# Patient Record
Sex: Male | Born: 1935 | Race: Black or African American | Hispanic: No | Smoking: Former smoker
Health system: Southern US, Community
[De-identification: ages and names within clinical notes are randomized; demographics above are authoritative.]

## PROBLEM LIST (undated history)

## (undated) DIAGNOSIS — J690 Pneumonitis due to inhalation of food and vomit: Secondary | ICD-10-CM

## (undated) DIAGNOSIS — F419 Anxiety disorder, unspecified: Secondary | ICD-10-CM

## (undated) DIAGNOSIS — S065XAA Traumatic subdural hemorrhage with loss of consciousness status unknown, initial encounter: Secondary | ICD-10-CM

## (undated) DIAGNOSIS — D649 Anemia, unspecified: Secondary | ICD-10-CM

## (undated) DIAGNOSIS — M24549 Contracture, unspecified hand: Secondary | ICD-10-CM

## (undated) DIAGNOSIS — I2699 Other pulmonary embolism without acute cor pulmonale: Secondary | ICD-10-CM

## (undated) DIAGNOSIS — F39 Unspecified mood [affective] disorder: Secondary | ICD-10-CM

## (undated) DIAGNOSIS — F0390 Unspecified dementia without behavioral disturbance: Secondary | ICD-10-CM

## (undated) DIAGNOSIS — N289 Disorder of kidney and ureter, unspecified: Secondary | ICD-10-CM

## (undated) DIAGNOSIS — I639 Cerebral infarction, unspecified: Secondary | ICD-10-CM

## (undated) DIAGNOSIS — I82409 Acute embolism and thrombosis of unspecified deep veins of unspecified lower extremity: Secondary | ICD-10-CM

## (undated) DIAGNOSIS — I10 Essential (primary) hypertension: Secondary | ICD-10-CM

## (undated) DIAGNOSIS — I6200 Nontraumatic subdural hemorrhage, unspecified: Secondary | ICD-10-CM

## (undated) DIAGNOSIS — E871 Hypo-osmolality and hyponatremia: Secondary | ICD-10-CM

## (undated) DIAGNOSIS — R1310 Dysphagia, unspecified: Secondary | ICD-10-CM

## (undated) DIAGNOSIS — I959 Hypotension, unspecified: Secondary | ICD-10-CM

## (undated) DIAGNOSIS — G629 Polyneuropathy, unspecified: Secondary | ICD-10-CM

## (undated) DIAGNOSIS — S065X9A Traumatic subdural hemorrhage with loss of consciousness of unspecified duration, initial encounter: Secondary | ICD-10-CM

## (undated) HISTORY — DX: Pneumonitis due to inhalation of food and vomit: J69.0

## (undated) HISTORY — DX: Acute embolism and thrombosis of unspecified deep veins of unspecified lower extremity: I82.409

## (undated) HISTORY — PX: OTHER SURGICAL HISTORY: SHX169

## (undated) HISTORY — DX: Unspecified dementia, unspecified severity, without behavioral disturbance, psychotic disturbance, mood disturbance, and anxiety: F03.90

---

## 2010-10-01 ENCOUNTER — Encounter: Admission: RE | Admit: 2010-10-01 | Discharge: 2010-10-01 | Payer: Self-pay | Admitting: Internal Medicine

## 2013-02-26 HISTORY — PX: PEG PLACEMENT: SHX5437

## 2013-09-16 ENCOUNTER — Other Ambulatory Visit (HOSPITAL_COMMUNITY): Payer: Self-pay | Admitting: Internal Medicine

## 2013-09-16 DIAGNOSIS — R1310 Dysphagia, unspecified: Secondary | ICD-10-CM

## 2013-09-23 ENCOUNTER — Other Ambulatory Visit (HOSPITAL_COMMUNITY): Payer: Self-pay | Admitting: Internal Medicine

## 2013-09-23 ENCOUNTER — Ambulatory Visit (HOSPITAL_COMMUNITY)
Admission: RE | Admit: 2013-09-23 | Discharge: 2013-09-23 | Disposition: A | Discharge status: Home / Self Care | Payer: PRIVATE HEALTH INSURANCE | Source: Ambulatory Visit | Attending: Internal Medicine | Admitting: Internal Medicine

## 2013-09-23 DIAGNOSIS — R1310 Dysphagia, unspecified: Secondary | ICD-10-CM | POA: Insufficient documentation

## 2013-09-23 DIAGNOSIS — R1311 Dysphagia, oral phase: Secondary | ICD-10-CM | POA: Insufficient documentation

## 2013-09-23 DIAGNOSIS — IMO0001 Reserved for inherently not codable concepts without codable children: Secondary | ICD-10-CM | POA: Insufficient documentation

## 2013-09-23 NOTE — Procedures (Signed)
Objective Swallowing Evaluation: Modified Barium Swallowing Study   Patient Details  Name: Juan Owens MRN: [id number] Date of Birth: 10-31-1931  Today's Date: 09/23/2013 Time: 11:03 AM - 11:35 AM    Past Medical History: No past medical history on file. Past Surgical History: No past surgical history on file. HPI:  Mr. Juan Owens is an 79 yo male resident at [facility] who is referred for MBSS. History was provided by his daughter who accompanied him to the appointment. Pt has dementia and fell several months ago and sustained SDH where he resided and subsequently had PEG placed in March 2014. He is currently at [facility] for rehabilitation. Dtr. reports that pt has had po trials since early September and has been stealing food off of trays because he wants to eat. She is hopeful that he can begin a consistent po diet and is willing to sign a waiver if need be.   Symptoms/Limitations Symptoms: Pt with dementia, accompanied by his daughter. Special Tests: MBSS  Recommendation/Prognosis  Clinical Impression Dysphagia Diagnosis: Mild oral phase dysphagia;Moderate pharyngeal phase dysphagia  Clinical impression: Mild oral phase and moderate pharyngeal phase sensorimotor based dysphagia in setting of dementia characterized by prolonged oral transit with solids, delay in swallow initiation with swallow trigger after filling pyriforms for liquids and puree, mild decreased tongue base retraction and epiglottic deflection resulting in variable flash penetration during the swallow with liquids and puree, mod pharyngeal residue. Pt does not appear sensate to pharyngeal residue consistently and does not respond with cues to repeat swallow (despite dry spoon presentation). Pt aspirates trace amounts of residuals when secondary swallow is elicited. Oral residuals trickle down to valleculae and are at risk for aspiration. Pt had subtle throat clears intermittently, but only strong spontaneous  coughs were effective in removing aspirate. Pt is at risk for aspiration on all consistencies/textures due to dementia, decreased sensation, and inability to follow cues to repeat swallow. I suspect aspiration would consistently be in trace amounts, however over the course of a meal/day it would be greater. Pt attempted to consume everything I gave him in its entirety and wanted more. He shows a tremendous interest in eating food by mouth. I suggested that daughter speak with his doctor in regards to offering pt a "safest diet" (accepting known risk for aspiration for comfort feeds) for pt's pleasure.  His daughter is willing to sign a waiver if available for her father to be able to eat by mouth. Should this be the course chosen, I recommend puree diet with honeythick liquids via teaspoon presentation. Information relayed verbally over the phone to treating SLP at [facility].  Swallow Evaluation Recommendations Diet Recommendations: Alternative means - long-term;Dysphagia 1 (Puree);Honey-thick liquid (safest diet rec) Liquid Administration via: Spoon Medication Administration: Via alternative means Supervision: Patient able to self feed;Full supervision/cueing for compensatory strategies;Trained caregiver to feed patient Compensations: Small sips/bites;Multiple dry swallows after each bite/sip;Hard cough after swallow Postural Changes and/or Swallow Maneuvers: Out of bed for meals;Seated upright 90 degrees;Upright 30-60 min after meal Oral Care Recommendations: Oral care before and after PO;Staff/trained caregiver to provide oral care Other Recommendations: Order thickener from pharmacy;Clarify dietary restrictions Follow up Recommendations: Skilled Nursing facility Prognosis Prognosis for Safe Diet Advancement: Fair Barriers to Reach Goals: Cognitive deficits Individuals Consulted Consulted and Agree with Results and Recommendations: Family member/caregiver Family Member Consulted: daughter Report  Sent to : Primary SLP;Facility (Comment)  General:  Date of Onset: 09/12/13 HPI: Mr. Juan Owens is an 79 yo male resident at [facility] who is referred for MBSS. History was provided by his daughter who accompanied him to the appointment. Pt has dementia and fell several months ago and sustained SDH where he resided and subsequently had PEG placed in March 2014. He is currently at [facility] for rehabilitation. Dtr. reports that pt has had po trials since early September and has been stealing food off of trays because he wants to eat. She is hopeful that he can begin a consistent po diet and is willing to sign a waiver if need be.  Type of Study: Modified Barium Swallowing Study Reason for Referral: Objectively evaluate swallowing function Previous Swallow Assessment: Reportedly had one at the end of August at [facility] Diet Prior to this Study: PEG tube Temperature Spikes Noted: No Respiratory Status: Room air History of Recent Intubation: No Behavior/Cognition: Alert;Cooperative;Requires cueing;Doesn't follow directions Oral Cavity - Dentition: Adequate natural dentition;Missing dentition Oral Motor / Sensory Function: Within functional limits Self-Feeding Abilities: Needs assist Patient Positioning: Upright in chair Baseline Vocal Quality: Clear Volitional Cough: Cognitively unable to elicit Volitional Swallow: Unable to elicit Anatomy: Within functional limits Pharyngeal Secretions: Not observed secondary MBS  Reason for Referral:  Objectively evaluate swallowing function   Oral Phase Oral Preparation/Oral Phase Oral Phase: Impaired Oral - Honey Oral - Honey Teaspoon: Lingual/palatal residue;Left pocketing in lateral sulci;Right pocketing in lateral sulci Oral - Nectar Oral - Nectar Cup: Right pocketing in lateral sulci;Left pocketing in lateral sulci;Lingual/palatal residue Oral - Solids Oral - Mechanical Soft: Delayed oral transit;Lingual/palatal residue Pharyngeal  Phase  Pharyngeal Phase Pharyngeal Phase: Impaired Pharyngeal - Honey Pharyngeal - Honey Teaspoon: Delayed swallow initiation;Premature spillage to pyriform sinuses;Premature spillage to valleculae;Reduced epiglottic inversion;Reduced tongue base retraction;Penetration/Aspiration after swallow;Trace aspiration;Pharyngeal residue - valleculae Penetration/Aspiration details (honey teaspoon): Material does not enter airway;Material enters airway, remains ABOVE vocal cords then ejected out;Material enters airway, passes BELOW cords and not ejected out despite cough attempt by patient Pharyngeal - Honey Cup: Delayed swallow initiation;Premature spillage to pyriform sinuses;Premature spillage to valleculae;Reduced epiglottic inversion;Reduced tongue base retraction;Penetration/Aspiration after swallow;Trace aspiration;Pharyngeal residue - valleculae Penetration/Aspiration details (honey cup): Material does not enter airway;Material enters airway, passes BELOW cords and not ejected out despite cough attempt by patient;Material enters airway, passes BELOW cords without attempt by patient to eject out (silent aspiration) Pharyngeal - Nectar Pharyngeal - Nectar Cup: Delayed swallow initiation;Premature spillage to pyriform sinuses;Premature spillage to valleculae;Reduced epiglottic inversion;Reduced tongue base retraction;Penetration/Aspiration after swallow;Trace aspiration;Pharyngeal residue - valleculae;Reduced airway/laryngeal closure;Penetration/Aspiration during swallow;Lateral channel residue Penetration/Aspiration details (nectar cup): Material enters airway, remains ABOVE vocal cords then ejected out;Material enters airway, passes BELOW cords without attempt by patient to eject out (silent aspiration);Material enters airway, passes BELOW cords and not ejected out despite cough attempt by patient Pharyngeal - Thin Pharyngeal - Thin Teaspoon: Not tested Pharyngeal - Solids Pharyngeal - Puree: Delayed  swallow initiation;Premature spillage to valleculae;Premature spillage to pyriform sinuses;Reduced epiglottic inversion;Reduced tongue base retraction;Penetration/Aspiration during swallow;Pharyngeal residue - valleculae Penetration/Aspiration details (puree): Material does not enter airway;Material enters airway, remains ABOVE vocal cords then ejected out Pharyngeal - Mechanical Soft: Delayed swallow initiation;Premature spillage to valleculae;Premature spillage to pyriform sinuses;Reduced epiglottic inversion;Reduced tongue base retraction;Pharyngeal residue - valleculae Pharyngeal - Pill: Not tested Cervical Esophageal Phase  Cervical Esophageal Phase Cervical Esophageal Phase: [facility]   G-Codes Functional Assessment Tool Used: MBSS Functional Limitations: Swallowing Swallow Current Status (W0981): At least 60 percent but less than 80 percent impaired, limited or restricted Swallow Goal Status [phone]): At least 20 percent but less than 40 percent impaired,  limited or restricted Swallow Discharge Status [phone]): At least 60 percent but less than 80 percent impaired, limited or restricted  Thank you,  Havery Moros, CCC-SLP [phone]  PORTER,DABNEY 09/23/2013, 12:33 PM                                                     Physician Treatment Plan  Your signature is required to indicate approval of the treatment plan/progress as stated above. Please make a copy of this report for your files and return this physician signed original in the self-addressed envelope or fax to [phone]. COMMENTS/CHANGES:__________________________________________________________________________________________________________________________   ____________________________________                      ____________________ Benjamin Stain                                                    DATE

## 2014-01-31 ENCOUNTER — Other Ambulatory Visit (HOSPITAL_COMMUNITY): Payer: Self-pay | Admitting: Internal Medicine

## 2014-01-31 DIAGNOSIS — R1310 Dysphagia, unspecified: Secondary | ICD-10-CM

## 2014-02-06 ENCOUNTER — Ambulatory Visit (HOSPITAL_COMMUNITY)
Admission: RE | Admit: 2014-02-06 | Discharge: 2014-02-06 | Disposition: A | Discharge status: Home / Self Care | Payer: PRIVATE HEALTH INSURANCE | Source: Ambulatory Visit | Attending: Internal Medicine | Admitting: Internal Medicine

## 2014-02-06 DIAGNOSIS — R1310 Dysphagia, unspecified: Secondary | ICD-10-CM

## 2014-02-06 DIAGNOSIS — IMO0001 Reserved for inherently not codable concepts without codable children: Secondary | ICD-10-CM | POA: Insufficient documentation

## 2014-02-06 DIAGNOSIS — R1311 Dysphagia, oral phase: Secondary | ICD-10-CM | POA: Insufficient documentation

## 2014-02-06 NOTE — Procedures (Signed)
Objective Swallowing Evaluation: Modified Barium Swallowing Study  Patient Details  Name: Worth Kober MRN: [id number] Date of Birth: 11-10-1931  Today's Date: 02/06/2014 Time: 1:14 PM  - 1:33 PM    Past Medical History: No past medical history on file. Past Surgical History: No past surgical history on file.  HPI:  Mr. Leondro Coryell is an 80 yo male resident at [facility] who is referred for MBSS. History was provided by his daughter who accompanied him to the appointment. Pt has dementia and fell several months ago and sustained SDH where he resided and subsequently had PEG placed in March 2014. He is currently at [facility] for rehabilitation.  Symptoms/Limitations Symptoms: Dtr reports that SLP wanted another MBSS, she's not exactly sure why Special Tests: MBSS  Recommendation/Prognosis  Clinical Impression:   Dysphagia Diagnosis: Moderate oral phase dysphagia;Moderate pharyngeal phase dysphagia Clinical impression: Mr. Ganim is known to SLP from previous MBSS in October 2014 with recommendations for pleasure feeds of puree and HTL. He was accompanied to today's appointment by his daughter. She states that she thinks he has been doing worse since he was last seen. She reports that he has had some po trials with SLP at [facility]. He presents today with moderate oropharyngeal phase dysphagia characterized by delayed oral transit, lingual pumping, and decreased bolus cohesion for anterior posterior transit, delay in swallow initiation (6+ seconds) across all consistencies/textures with swallow trigger after filling the pyriforms resulting in penetration/aspiration of nectars and honey thick liquids mostly silent in nature, some with a cough which was ineffective in clearing aspirate. His initial swallow (once initiated) is actually effective/strong and results in little to no pharyngeal residue, however residuals from oral cavity pool down to valleculae and pyriforms and pt has greater  difficulty initiating a secondary swallow (lingual pumping, soft palate rises, epiglottic starts to flip and then the pooled pyriform residue backflows up posterior pharyngeal wall). Pt deemed safest with puree via teaspoon presentation and showed no penetration or aspiration. Pt required increased verbal cueing as study progressed due to pt becoming drowsy. Recommend continued PEG feedings with trials puree and consider pudding thick liquids if possible.    Swallow Evaluation Recommendations:  Diet Recommendations: Alternative means - long-term;Dysphagia 1 (Puree);Pudding-thick liquid Liquid Administration via: Spoon Medication Administration: Via alternative means Supervision: Full supervision/cueing for compensatory strategies Compensations: Small sips/bites;Multiple dry swallows after each bite/sip Postural Changes and/or Swallow Maneuvers: Seated upright 90 degrees;Upright 30-60 min after meal Oral Care Recommendations: Oral care Q4 per protocol Other Recommendations: Order thickener from pharmacy;Clarify dietary restrictions Follow up Recommendations: Skilled Nursing facility    Prognosis:  Prognosis for Safe Diet Advancement: Guarded Barriers to Reach Goals: Time post onset;Severity of dysphagia   Individuals Consulted: Consulted and Agree with Results and Recommendations: Family member/caregiver Family Member Consulted: daughter Report Sent to : Facility (Comment);Referring physician ([facility])      SLP Assessment/Plan Clinical Impression:   Dysphagia Diagnosis: Moderate oral phase dysphagia;Moderate pharyngeal phase dysphagia Clinical impression: Mr. Zaremba is known to SLP from previous MBSS in October 2014 with recommendations for pleasure feeds of puree and HTL. He was accompanied to today's appointment by his daughter. She states that she thinks he has been doing worse since he was last seen. She reports that he has had some po trials with SLP at [facility]. He presents today with  moderate oropharyngeal phase dysphagia characterized by delayed oral transit, lingual pumping, and decreased bolus cohesion for anterior posterior transit, delay in swallow initiation (6+ seconds) across all  consistencies/textures with swallow trigger after filling the pyriforms resulting in penetration/aspiration of nectars and honey thick liquids mostly silent in nature, some with a cough which was ineffective in clearing aspirate. His initial swallow (once initiated) is actually effective/strong and results in little to no pharyngeal residue, however residuals from oral cavity pool down to valleculae and pyriforms and pt has greater difficulty initiating a secondary swallow (lingual pumping, soft palate rises, epiglottic starts to flip and then the pooled pyriform residue backflows up posterior pharyngeal wall). Pt deemed safest with puree via teaspoon presentation and showed no penetration or aspiration. Pt required increased verbal cueing as study progressed due to pt becoming drowsy. Recommend continued PEG feedings with trials puree and consider pudding thick liquids if possible.    Plan:   Per treating SLP       General: Date of Onset: 02/06/13 Type of Study: Modified Barium Swallowing Study Reason for Referral: Objectively evaluate swallowing function Previous Swallow Assessment: 09/23/2013: high risk for aspiration, trials puree/HTL for pleasure Diet Prior to this Study: PEG tube;NPO (some trials with SLP per dtr. report) Temperature Spikes Noted: No Respiratory Status: Room air History of Recent Intubation: No Behavior/Cognition: Alert;Cooperative;Decreased sustained attention Oral Cavity - Dentition: Edentulous Self-Feeding Abilities: Needs assist Patient Positioning: Upright in chair Baseline Vocal Quality: Low vocal intensity Volitional Cough: Cognitively unable to elicit Volitional Swallow: Unable to elicit Anatomy: Within functional limits Pharyngeal Secretions: Not observed  secondary MBS   Reason for Referral:   Objectively evaluate swallowing function    Oral Phase: Oral Preparation/Oral Phase Oral Phase: Impaired Oral - Honey Oral - Honey Teaspoon: Lingual pumping;Reduced posterior propulsion;Lingual/palatal residue Oral - Honey Cup: Lingual pumping;Reduced posterior propulsion;Lingual/palatal residue;Piecemeal swallowing Oral - Nectar Oral - Nectar Teaspoon: Lingual pumping;Reduced posterior propulsion;Lingual/palatal residue Oral - Solids Oral - Puree: Lingual pumping;Reduced posterior propulsion;Piecemeal swallowing;Lingual/palatal residue Oral - Mechanical Soft: Lingual pumping;Reduced posterior propulsion;Lingual/palatal residue   Pharyngeal Phase:  Pharyngeal Phase Pharyngeal Phase: Impaired Pharyngeal - Honey Pharyngeal - Honey Teaspoon: Delayed swallow initiation;Premature spillage to valleculae;Premature spillage to pyriform sinuses;Reduced airway/laryngeal closure;Penetration/Aspiration before swallow;Penetration/Aspiration after swallow;Trace aspiration Penetration/Aspiration details (honey teaspoon): Material enters airway, passes BELOW cords and not ejected out despite cough attempt by patient;Material enters airway, passes BELOW cords without attempt by patient to eject out (silent aspiration);Material enters airway, CONTACTS cords and not ejected out Pharyngeal - Honey Cup: Delayed swallow initiation;Premature spillage to valleculae;Premature spillage to pyriform sinuses;Reduced airway/laryngeal closure;Trace aspiration;Penetration/Aspiration before swallow;Penetration/Aspiration during swallow;Penetration/Aspiration after swallow Penetration/Aspiration details (honey cup): Material enters airway, passes BELOW cords without attempt by patient to eject out (silent aspiration);Material enters airway, passes BELOW cords and not ejected out despite cough attempt by patient;Material enters airway, remains ABOVE vocal cords and not ejected  out Pharyngeal - Nectar Pharyngeal - Nectar Teaspoon: Delayed swallow initiation;Premature spillage to valleculae;Premature spillage to pyriform sinuses;Reduced airway/laryngeal closure;Trace aspiration;Penetration/Aspiration before swallow;Penetration/Aspiration during swallow;Penetration/Aspiration after swallow Penetration/Aspiration details (nectar teaspoon): Material enters airway, passes BELOW cords and not ejected out despite cough attempt by patient;Material enters airway, passes BELOW cords without attempt by patient to eject out (silent aspiration) Pharyngeal - Solids Pharyngeal - Puree: Delayed swallow initiation;Premature spillage to valleculae;Premature spillage to pyriform sinuses;Pharyngeal residue - valleculae (trace residuals post swallow in valleculae) Pharyngeal - Mechanical Soft: Delayed swallow initiation;Premature spillage to valleculae;Premature spillage to pyriform sinuses;Pharyngeal residue - pyriform sinuses   Cervical Esophageal Phase  Cervical Esophageal Phase Cervical Esophageal Phase: WFL   G Codes: Swallowing, MBSS: CM, CK, CM  Thank you,  Havery Moros, CCC-SLP [phone]  Haislee Corso  02/06/2014, 3:17 PM

## 2014-10-07 ENCOUNTER — Encounter: Payer: Self-pay | Admitting: Gastroenterology

## 2014-10-07 ENCOUNTER — Ambulatory Visit (INDEPENDENT_AMBULATORY_CARE_PROVIDER_SITE_OTHER): Payer: PRIVATE HEALTH INSURANCE | Admitting: Gastroenterology

## 2014-10-07 VITALS — BP 94/65 | HR 67 | Ht 69.0 in

## 2014-10-07 DIAGNOSIS — R1111 Vomiting without nausea: Secondary | ICD-10-CM

## 2014-10-07 DIAGNOSIS — R1310 Dysphagia, unspecified: Secondary | ICD-10-CM

## 2014-10-07 NOTE — Patient Instructions (Signed)
Continue Nexium daily.   Nutrition consult to discuss continuous feeds instead of bolus feeds.   I have requested nursing staff to check gastric residuals every 6 hours.   Return in 6 weeks. We will keep the tube in place now and not exchange it unless there is evidence of tube malfunction.

## 2014-10-07 NOTE — Progress Notes (Signed)
       Primary Care Physician:  Pearson GrippeKIM, JAMES, MD Primary Gastroenterologist:  Dr. Jena Gaussourk   Chief Complaint  Patient presents with  . Referral    HPI:   Juan Owens presents today at the request of [facility] secondary to regurgitation tube feeds during bolus feeding. History of dementia.  Daughter, Juan Owens, present for appointment and is legal guardian. [facility] I do not have these records. Unable to tolerate oral feeding due to severe dysphagia. Bolus feeds resulted in regurgitation. Started Nexium 40 mg on the 26th, with no further regurgitation or coughing up tube feeds. Speech evaluation March 2015 with moderate oropharyngeal phase dysphagia. Daughter also wonders if the tube needs to be replaced/exchanged.   Past Medical History  Diagnosis Date  . Dementia   . Aspiration pneumonia   . DVT (deep venous thrombosis)     Past Surgical History  Procedure Laterality Date  . Ivc filter      Current Outpatient Prescriptions  Medication Sig Dispense Refill  . Cyanocobalamin (VITAMIN B-12 IJ) Inject as directed every 30 (thirty) days.    Marland Kitchen. esomeprazole (NEXIUM) 40 MG capsule Take 40 mg by mouth daily at 12 noon.    Marland Kitchen. LORazepam (ATIVAN) 0.5 MG tablet Take 0.5 mg by mouth as needed for anxiety.    . sertraline (ZOLOFT) 25 MG tablet Take 25 mg by mouth daily.    Marland Kitchen. valproate (DEPAKENE) 250 MG/5ML syrup Take by mouth daily.     No current facility-administered medications for this visit.    Allergies as of 10/07/2014  . (Not on File)    Family History  Problem Relation Age of Onset  . Colon cancer Neg Hx     History   Social History  . Marital Status: Widowed    Spouse Name: N/A    Number of Children: N/A  . Years of Education: N/A   Occupational History  . Not on file.   Social History Main Topics  . Smoking status: Former Smoker    Quit date: 11/28/2010  . Smokeless tobacco: Not on file  . Alcohol Use: No  . Drug Use: No  .  Sexual Activity: Not on file   Other Topics Concern  . Not on file   Social History Narrative  . No narrative on file    Review of Systems: Negative unless mentioned in HPI  Physical Exam: BP 94/65 mmHg  Pulse 67  Temp(Src)   Ht 5\' 9"  (1.753 m)  Wt  (wheelchair) General:   Sitting in wheelchair. Non-verbal. Unable to assess orientation. Making noises with mouth closed.  Head:  Normocephalic and atraumatic. Eyes:  Without icterus, sclera clear and conjunctiva pink.  Mouth:  Oral mucosa pink and moist Lungs:  Clear to auscultation bilaterally. No wheezes, rales, or rhonchi. No distress.  Heart:  S1, S2 present without murmurs appreciated.  Abdomen:  Limited exam with patient in wheelchair, +BS, soft, non-tender and non-distended. PEG tube in place, intact, small amounts of brown discoloration in tube but integrity of tube remains Rectal:  Deferred  Msk:  kyphosis Extremities:  Without  edema. Skin:  Intact without significant lesions or rashes. Psych:  Flat affect

## 2014-10-13 DIAGNOSIS — R1110 Vomiting, unspecified: Secondary | ICD-10-CM | POA: Insufficient documentation

## 2014-10-13 NOTE — Assessment & Plan Note (Addendum)
Regurgitation of tube feeds with bolus feedings. Query relaxed lower esophageal sphincter; however, resolution of regurgitation with addition of Nexium daily. Will request nutrition consult for consideration of continuous feeds instead of bolus feeds, which could worsen regurgitation. Need to check gastric residuals every 6 hours. [facility]; will need to request operative notes. May ultimately need tube exchange/replacement but no evidence of leakage at site or deterioration. Small amounts of brown discoloration in tube, unclear if this is tube feeds or possible early signs of need to replace. Will return in 6 weeks to discuss further.

## 2014-10-13 NOTE — Assessment & Plan Note (Signed)
In setting of dementia, with need for PEG tube. history of oropharyngeal dysphagia.

## 2014-10-22 NOTE — Progress Notes (Signed)
cc'ed to pcp °

## 2014-10-31 ENCOUNTER — Ambulatory Visit: Payer: Medicare Other | Admitting: Gastroenterology

## 2014-12-03 ENCOUNTER — Ambulatory Visit (INDEPENDENT_AMBULATORY_CARE_PROVIDER_SITE_OTHER): Payer: Medicare Other | Admitting: Gastroenterology

## 2014-12-03 ENCOUNTER — Encounter: Payer: Self-pay | Admitting: Gastroenterology

## 2014-12-03 VITALS — BP 101/65 | HR 64 | Temp 97.0°F

## 2014-12-03 DIAGNOSIS — R1111 Vomiting without nausea: Secondary | ICD-10-CM

## 2014-12-03 NOTE — Progress Notes (Signed)
    Referring Provider: Pearson GrippeKim, James, MD Primary Care Physician:  Pearson GrippeKIM, JAMES, MD  Primary GI: Dr. Jena Gaussourk   Chief Complaint  Patient presents with  . coughing up tube feeding    HPI:   Juan Owens is a 81 y.o. male presenting today with a history of tube feed regurgitation during bolus feeding. History of dementia. Last seen Nov 2015. [facility]; difficulty obtaining records and not in care everywhere. Started Nexium 40mg  daily prior to original consultation with no further regurgitation or coughing by time of original consultation. Known moderate oropharyngeal phase dysphagia with speech eval on file.   Present today with CNA; daughter not present today. Contacted facility. Nurse covering for the hall. Unclear if he is having continued regurgitation or any issues with tube feeds, but nurse filling in today stated there was no mention of this issue to her today.   Past Medical History  Diagnosis Date  . Dementia   . Aspiration pneumonia   . DVT (deep venous thrombosis)     Past Surgical History  Procedure Laterality Date  . Ivc filter      Current Outpatient Prescriptions  Medication Sig Dispense Refill  . Cholecalciferol (VITAMIN D) 2000 UNITS tablet Place 2,000 Units into feeding tube daily.    Marland Kitchen. esomeprazole (NEXIUM) 40 MG capsule Take 40 mg by mouth daily at 12 noon.    Marland Kitchen. LORazepam (ATIVAN) 0.5 MG tablet Take 0.5 mg by mouth as needed for anxiety.    . sertraline (ZOLOFT) 25 MG tablet Take 25 mg by mouth daily.    Marland Kitchen. sterile water for irrigation 237 mL Give by tube 3 (three) times daily.    Marland Kitchen. valproate (DEPAKENE) 250 MG/5ML syrup Take by mouth daily.     No current facility-administered medications for this visit.    Allergies as of 12/03/2014 - Review Complete 12/03/2014  Allergen Reaction Noted  . No known allergies  12/03/2014    Family History  Problem Relation Age of Onset  . Colon cancer Neg Hx     History   Social History    . Marital Status: Widowed    Spouse Name: N/A    Number of Children: N/A  . Years of Education: N/A   Social History Main Topics  . Smoking status: Former Smoker    Quit date: 11/28/2010  . Smokeless tobacco: None  . Alcohol Use: No  . Drug Use: No  . Sexual Activity: None   Other Topics Concern  . None   Social History Narrative    Review of Systems: Unable to obtain.   Physical Exam: BP 101/65 mmHg  Pulse 64  Temp(Src) 97 F (36.1 C) General:   Sitting in wheelchair, non-verbal. Unable to assess orientation. Grunting, making noises with mouth closed.  Head:  Normocephalic and atraumatic. Eyes:  Conjuctiva clear without scleral icterus. Mouth:  Oral mucosa pink and moist.  Heart:  S1, S2 present without murmurs Abdomen:  Limited exam with patient in wheelchair.+BS, soft, non-tender and non-distended.  PEG tube intact, integrity remains. Brown discoloration but no breakdown.  Extremities:  Without edema. Neurologic:  Unable to assess orientation.

## 2014-12-03 NOTE — Patient Instructions (Signed)
Continue Nexium once each morning per tube.   Contact us if any further regurgitation. Contact our office if any leakage around PEG site or deterioration of tube,.   Return in 3 months.

## 2014-12-03 NOTE — Assessment & Plan Note (Signed)
81 year old male with known dementia, moderate oropharyngeal dysphagia requiring chronic bolus tube feeds. Appears with addition of Nexium, regurgitation has ceased. High risk for aspiration regardless. [facility]; will attempt to request records again. No evidence of breakdown in tube integrity; site intact. Continue bolus feeds with assessment of gastric residuals; contact our office if any further regurgitation or tube feed malfunction. Return in 3 months.

## 2014-12-09 NOTE — Progress Notes (Signed)
cc'ed to pcp °

## 2014-12-25 ENCOUNTER — Telehealth: Payer: Self-pay | Admitting: Gastroenterology

## 2014-12-25 NOTE — Telephone Encounter (Signed)
Juan Carmelasha Dove, PA with [facility] called asking for a verbal on patient's recent OV on 1/6. Call was transferred to DS. Juan Owens would like for SF to also call her at [phone]

## 2014-12-25 NOTE — Telephone Encounter (Signed)
I spoke to Juan Owens, [place] at [facility]. Pt has aspiration pneumonia again. I read the notes from the OV notes of [name], NP. She would like to discuss with [name] and I told her [name] is gone for the day, I will see if she can call her tomorrow morning at [phone].

## 2014-12-26 NOTE — Telephone Encounter (Signed)
Attempted to call Juan Owens but had to leave voicemail.

## 2014-12-26 NOTE — Telephone Encounter (Signed)
PT IS A RMR PT. 

## 2014-12-26 NOTE — Telephone Encounter (Signed)
DISCUSS WITH DR. Jena GaussOURK.

## 2014-12-29 NOTE — Telephone Encounter (Addendum)
Discussed with Lillia Carmelasha Dove, NP. Aspiration precautions discussed. G-tube without any malfunctioning. Instructed to increase Nexium to BID. Patient with dementia, non-verbal. Keep appt for April. Tasha to call if any further issues or concerns.

## 2014-12-29 NOTE — Telephone Encounter (Signed)
I spoke to [name], NP, she said Rodney Boozeasha returned her call and now she has to return her call and it is on her to-do-list for today.

## 2015-03-05 ENCOUNTER — Ambulatory Visit (INDEPENDENT_AMBULATORY_CARE_PROVIDER_SITE_OTHER): Payer: Medicare Other | Admitting: Gastroenterology

## 2015-03-05 ENCOUNTER — Encounter: Payer: Self-pay | Admitting: Gastroenterology

## 2015-03-05 VITALS — BP 96/61 | HR 77 | Temp 97.6°F | Wt 154.6 lb

## 2015-03-05 DIAGNOSIS — R740 Nonspecific elevation of levels of transaminase and lactic acid dehydrogenase [LDH]: Secondary | ICD-10-CM | POA: Diagnosis not present

## 2015-03-05 DIAGNOSIS — R748 Abnormal levels of other serum enzymes: Secondary | ICD-10-CM

## 2015-03-05 NOTE — Patient Instructions (Signed)
Some of your liver numbers were just mildly elevated. I am rechecking those today.   As long as your feeding tube is functioning well, we will leave it alone.   I would like to see you back in 6 months!

## 2015-03-10 ENCOUNTER — Encounter: Payer: Self-pay | Admitting: Gastroenterology

## 2015-03-10 NOTE — Progress Notes (Signed)
    Referring Provider: Pearson GrippeKim, James, MD Primary Care Physician:  Pearson GrippeKIM, JAMES, MD  Primary GI: Dr. Jena Gaussourk   Chief Complaint  Patient presents with  . Follow-up    HPI:   Juan Owens is a 81 y.o. male presenting today with a history of tube feed regurgitation during bolus feeding. History of dementia. [facility]. Known moderate oropharyngeal phase dysphagia with speech eval on file. Doing well currently. Pt does not respond to questions. No concerns noted. Review of outside labs with mild elevated transaminases in early Jan and subsequent resolution. (AST 58, ALT 43 in early Jan).   Past Medical History  Diagnosis Date  . Dementia   . Aspiration pneumonia   . DVT (deep venous thrombosis)     Past Surgical History  Procedure Laterality Date  . Ivc filter      Current Outpatient Prescriptions  Medication Sig Dispense Refill  . Cholecalciferol (VITAMIN D) 2000 UNITS tablet Place 2,000 Units into feeding tube daily.    Marland Kitchen. esomeprazole (NEXIUM) 40 MG capsule Take 40 mg by mouth daily at 12 noon.    Marland Kitchen. LORazepam (ATIVAN) 0.5 MG tablet Take 0.5 mg by mouth as needed for anxiety.    . sertraline (ZOLOFT) 25 MG tablet Take 25 mg by mouth daily.    Marland Kitchen. sterile water for irrigation 237 mL Give by tube 3 (three) times daily.    Marland Kitchen. valproate (DEPAKENE) 250 MG/5ML syrup Take by mouth daily.     No current facility-administered medications for this visit.    Allergies as of 03/05/2015 - Review Complete 12/03/2014  Allergen Reaction Noted  . No known allergies  12/03/2014    Family History  Problem Relation Age of Onset  . Colon cancer Neg Hx     History   Social History  . Marital Status: Widowed    Spouse Name: N/A  . Number of Children: N/A  . Years of Education: N/A   Social History Main Topics  . Smoking status: Former Smoker    Quit date: 11/28/2010  . Smokeless tobacco: Not on file  . Alcohol Use: No  . Drug Use: No  . Sexual Activity: Not on file     Other Topics Concern  . None   Social History Narrative    Review of Systems: Unable to obtain  Physical Exam: BP 96/61 mmHg  Pulse 77  Temp(Src) 97.6 F (36.4 C) (Oral)  Wt 154 lb 9.6 oz (70.126 kg) General:   Alert, unable to assess orientation. Sitting in wheelchair.  Head:  Normocephalic and atraumatic. Eyes:  Conjuctiva clear without scleral icterus. Mouth:  Oral mucosa pink and moist. Heart:  S1, S2 present without murmurs, rubs, or gallops. Regular rate and rhythm. Abdomen:  Limited exam with patient in wheelchair. +BS, soft, non-tender, non-distended. PEG tube site intact, integrity remains.  Neurologic:  Unable to assess orientation  Outside labs Dec 03, 2014: AST 58, ALT 43                        Dec 12, 2014: AST 26, ALT 32

## 2015-03-10 NOTE — Assessment & Plan Note (Signed)
Very mildly elevated AST/ALT in early Jan, with subsequent resolution a week later. Will recheck now. If any further bumps, proceed with US abdomen. As of note, [facility]. Functioning well. Patient REMAINS at risk for aspiration, which has been discussed with NP at the facility. Continue supportive measures, PPI, and return in 6 months.

## 2015-03-11 NOTE — Progress Notes (Signed)
cc'ed to pcp °

## 2015-05-09 ENCOUNTER — Encounter (HOSPITAL_COMMUNITY): Payer: Self-pay

## 2015-05-09 ENCOUNTER — Emergency Department (HOSPITAL_COMMUNITY)
Admission: EM | Admit: 2015-05-09 | Discharge: 2015-05-09 | Disposition: A | Discharge status: Skilled Nursing Facility | Payer: Medicare Other | Attending: Emergency Medicine | Admitting: Emergency Medicine

## 2015-05-09 DIAGNOSIS — F0390 Unspecified dementia without behavioral disturbance: Secondary | ICD-10-CM | POA: Insufficient documentation

## 2015-05-09 DIAGNOSIS — Z87891 Personal history of nicotine dependence: Secondary | ICD-10-CM | POA: Insufficient documentation

## 2015-05-09 DIAGNOSIS — Z86718 Personal history of other venous thrombosis and embolism: Secondary | ICD-10-CM | POA: Insufficient documentation

## 2015-05-09 DIAGNOSIS — Z79899 Other long term (current) drug therapy: Secondary | ICD-10-CM | POA: Diagnosis not present

## 2015-05-09 DIAGNOSIS — Y838 Other surgical procedures as the cause of abnormal reaction of the patient, or of later complication, without mention of misadventure at the time of the procedure: Secondary | ICD-10-CM | POA: Insufficient documentation

## 2015-05-09 DIAGNOSIS — K9423 Gastrostomy malfunction: Secondary | ICD-10-CM | POA: Insufficient documentation

## 2015-05-09 DIAGNOSIS — Z8701 Personal history of pneumonia (recurrent): Secondary | ICD-10-CM | POA: Diagnosis not present

## 2015-05-09 DIAGNOSIS — T85598A Other mechanical complication of other gastrointestinal prosthetic devices, implants and grafts, initial encounter: Secondary | ICD-10-CM

## 2015-05-09 NOTE — ED Notes (Signed)
[facility] nurse said she is going to take the pt back via wheelchair instead of waiting for EMS.

## 2015-05-09 NOTE — ED Notes (Signed)
Pt DC back to [facility].

## 2015-05-09 NOTE — ED Notes (Signed)
[facility] staff reports that pt has had problem with peg tube leaking for " a while. "  Reports the tube hasn't been changed in a long time and family requested it be replaced.  Per staff, pt is nonverbal.

## 2015-05-09 NOTE — ED Notes (Signed)
Per Dr. Deretha Emory, this nurse cut the cracked tip of tube off and replaced cap, no leaking observed.

## 2015-05-09 NOTE — Discharge Instructions (Signed)
The leaking site on the gastric tube was repaired by cutting out the crack in the tube and reinserting the ports. Here on the weekends not able to completely replace a gastric tube. Kelly replaced temporarily by a Foley catheter. However the tube is now working properly. Would recommend however evaluation for possible gastric tube replacement in the near future this tube appears to be quite old.

## 2015-05-09 NOTE — ED Provider Notes (Signed)
CSN: [id number]     Arrival date & time 05/09/15  1307 History   First MD Initiated Contact with Patient 05/09/15 1407     Chief Complaint  Patient presents with  . Peg Tube Leaking      (Consider location/radiation/quality/duration/timing/severity/associated sxs/prior Treatment) The history is provided by the nursing home. The history is limited by the condition of the patient.   history level V caveat applies due to dementia. Patient sent over from [facility] nursing home for family requesting replacement of the G-tube. The G-tube is not clogged is functioning it is leaking. It apparently has not been replaced for a long period of time. Patient without any significant problems or distress.  Past Medical History  Diagnosis Date  . Dementia   . Aspiration pneumonia   . DVT (deep venous thrombosis)    Past Surgical History  Procedure Laterality Date  . Ivc filter    . Peg placement  April 2014    Dr. Michaell Cowing at [facility] History  Problem Relation Age of Onset  . Colon cancer Neg Hx    History  Substance Use Topics  . Smoking status: Former Smoker    Quit date: 11/28/2010  . Smokeless tobacco: Not on file  . Alcohol Use: No    Review of Systems  Unable to perform ROS  the patient level V caveat not on review of systems due to dementia.    Allergies  No known allergies  Home Medications   Prior to Admission medications   Medication Sig Start Date End Date Taking? Authorizing Provider  Cholecalciferol (VITAMIN D) 2000 UNITS tablet Place 2,000 Units into feeding tube daily.    Historical Provider, MD  esomeprazole (NEXIUM) 40 MG capsule Take 40 mg by mouth daily at 12 noon.    Historical Provider, MD  LORazepam (ATIVAN) 0.5 MG tablet Take 0.5 mg by mouth as needed for anxiety.    Historical Provider, MD  sertraline (ZOLOFT) 25 MG tablet Take 25 mg by mouth daily.    Historical Provider, MD  sterile water for irrigation 237 mL Give by tube 3 (three) times daily.     Historical Provider, MD  valproate (DEPAKENE) 250 MG/5ML syrup Take by mouth daily.    Historical Provider, MD   BP 127/69 mmHg  Pulse 73  Temp(Src) 97.3 F (36.3 C) (Oral)  Resp 18  Ht 6' (1.829 m)  Wt 160 lb (72.576 kg)  BMI 21.70 kg/m2  SpO2 98% Physical Exam  Constitutional: He appears well-developed and well-nourished. No distress.  HENT:  Head: Normocephalic and atraumatic.  Cardiovascular: Normal rate, regular rhythm and normal heart sounds.   Pulmonary/Chest: Effort normal and breath sounds normal. No respiratory distress.  Abdominal: Soft. Bowel sounds are normal. He exhibits no distension. There is no tenderness.  G-tube of in the left upper quadrant area. Still held in place by the balloon. But the cuff that was sutured to the skin is no longer attached. There is leakage at the distal part of the silicone G-tube being due to a crack in it close to where the ports insert.  Neurological: He is alert.  Skin: Skin is warm. No erythema.  Nursing note and vitals reviewed.   ED Course  Procedures (including critical care time) Labs Review Labs Reviewed - No data to display  Imaging Review No results found.   EKG Interpretation None      MDM   Final diagnoses:  Feeding tube dysfunction, initial encounter  Patient with leakage of the gastric tube where the ports attached into the silicone tube. That cracked area was cut off and the ports were reinserted.   this solves the leakage problem. The G-tube and overall seems to be quite old and would recommend arrangements by the nursing facility to perhaps have this replaced.   Vanetta Mulders, MD 05/09/15 [phone]

## 2015-07-02 ENCOUNTER — Encounter (HOSPITAL_COMMUNITY): Payer: Self-pay | Admitting: *Deleted

## 2015-07-02 ENCOUNTER — Encounter (HOSPITAL_COMMUNITY)
Admission: RE | Disposition: A | Discharge status: Skilled Nursing Facility | Payer: Self-pay | Source: Ambulatory Visit | Attending: Gastroenterology

## 2015-07-02 ENCOUNTER — Ambulatory Visit (HOSPITAL_COMMUNITY)
Admission: RE | Admit: 2015-07-02 | Discharge: 2015-07-02 | Disposition: A | Discharge status: Skilled Nursing Facility | Payer: Medicare Other | Source: Ambulatory Visit | Attending: Gastroenterology | Admitting: Gastroenterology

## 2015-07-02 ENCOUNTER — Telehealth: Payer: Self-pay

## 2015-07-02 ENCOUNTER — Other Ambulatory Visit: Payer: Self-pay

## 2015-07-02 DIAGNOSIS — Z431 Encounter for attention to gastrostomy: Secondary | ICD-10-CM | POA: Diagnosis present

## 2015-07-02 DIAGNOSIS — R1310 Dysphagia, unspecified: Secondary | ICD-10-CM | POA: Diagnosis not present

## 2015-07-02 DIAGNOSIS — Z931 Gastrostomy status: Secondary | ICD-10-CM

## 2015-07-02 HISTORY — PX: PEG PLACEMENT: SHX5437

## 2015-07-02 SURGERY — REPLACEMENT, PEG TUBE, WITHOUT ENDOSCOPY
Anesthesia: LOCAL

## 2015-07-02 NOTE — Procedures (Signed)
  PROCEDURE TECHNIQUE:  PT PREPPED AND DRAPED. 20 fr BUMPER PEG REMOVED. BALLOON CHECKED. REPLACED WITH 20 FR BALLOON PEG. 6 CC STERILE WATER IN BALLOON. SKIN AT 3 CM. TOP OF BUMPER AT 5 CM. ALL PORTS FLUSHED AND ASPIRATED.  PLAN: 1. TUBE FEEDS/MEDS TODAY. 2. OPV PRN.

## 2015-07-02 NOTE — Telephone Encounter (Signed)
SEND PT TO [facility] ENDOSCOPY FOR PEG CHANGE.

## 2015-07-02 NOTE — Telephone Encounter (Signed)
Noted and orders entered 

## 2015-07-02 NOTE — Telephone Encounter (Signed)
Received call from [facility] . Spoke with nurse April Reid and she states that pt went on a 30 day leave and came back to facility and his peg tube had been cut by family.  She states that the tube is is intact but feels thin and really stretched. Medication and liquid flows fine but the tube is worn.  States that pt needs a replacement ASAP.

## 2015-07-06 ENCOUNTER — Encounter (HOSPITAL_COMMUNITY): Payer: Self-pay | Admitting: Gastroenterology

## 2015-09-03 ENCOUNTER — Ambulatory Visit (INDEPENDENT_AMBULATORY_CARE_PROVIDER_SITE_OTHER): Payer: Medicare Other | Admitting: Gastroenterology

## 2015-09-03 ENCOUNTER — Encounter: Payer: Self-pay | Admitting: Gastroenterology

## 2015-09-03 VITALS — BP 94/57 | HR 63 | Temp 98.2°F | Ht 72.0 in | Wt 144.6 lb

## 2015-09-03 DIAGNOSIS — Z931 Gastrostomy status: Secondary | ICD-10-CM | POA: Diagnosis not present

## 2015-09-03 NOTE — Patient Instructions (Signed)
We will see you back as needed.   Continue your Nexium.   I have requested your last liver numbers.

## 2015-09-03 NOTE — Assessment & Plan Note (Signed)
Doing well with tube feeding. Tube recently exchanged and without complications. No need for further follow-up unless tube malfunction. Obtain most recent LFTs (mild elevation transaminases in Jan 2016 with resolution thereafter).

## 2015-09-03 NOTE — Progress Notes (Signed)
CC'D TO PCP °

## 2015-09-03 NOTE — Progress Notes (Signed)
    Referring Provider: Pearson Grippe, MD Primary Care Physician:  Pearson Grippe, MD  Dr. Darrick Penna   Chief Complaint  Patient presents with  . Follow-up    HPI:   Juan Owens is a 81 y.o. male presenting today with a history of tube feed regurgitation earlier this year during bolus feeding. History of dementia, non-verbal. Original PEG placed in 2014 by [facility] and recently replaced in Aug 2016 due to tube deterioration. 20 F tube exchange completed with bumper at 5cm and 3cm at skin. No reports of regurgitation per caretaker. No signs of abdominal pain. No vomiting. Tube feeds without difficulty. History of mild bumps in AST/ALT in Jan of this year with subsequent resolution a week later. Likely insignificant. Although asked to recheck, we have not received reports. Will request again.   Past Medical History  Diagnosis Date  . Dementia   . Aspiration pneumonia (HCC)   . DVT (deep venous thrombosis) [facility])     Past Surgical History  Procedure Laterality Date  . Ivc filter    . Peg placement  April 2014    Dr. Michaell Cowing at [facility]  . Peg placement N/A 07/02/2015    Procedure: PERCUTANEOUS ENDOSCOPIC GASTROSTOMY (PEG) REPLACEMENT;  Surgeon: West Bali, MD;  Location: [facility];  Service: Endoscopy;  Laterality: N/A;    Current Outpatient Prescriptions  Medication Sig Dispense Refill  . Cholecalciferol (VITAMIN D) 2000 UNITS tablet Place 2,000 Units into feeding tube daily.    Marland Kitchen esomeprazole (NEXIUM) 40 MG capsule Take 40 mg by mouth daily at 12 noon.    Marland Kitchen ipratropium-albuterol (DUONEB) 0.5-2.5 (3) MG/3ML SOLN Take 3 mLs by nebulization.    Marland Kitchen LORazepam (ATIVAN) 0.5 MG tablet Take 0.5 mg by mouth as needed for anxiety.    . sertraline (ZOLOFT) 25 MG tablet Take 25 mg by mouth daily.    Marland Kitchen sterile water for irrigation 237 mL Give by tube 3 (three) times daily.    Marland Kitchen valproate (DEPAKENE) 250 MG/5ML syrup Take by mouth daily.     No current facility-administered medications for this visit.      Allergies as of 09/03/2015 - Review Complete 07/02/2015  Allergen Reaction Noted  . No known allergies  12/03/2014    Family History  Problem Relation Age of Onset  . Colon cancer Neg Hx     Social History   Social History  . Marital Status: Widowed    Spouse Name: N/A  . Number of Children: N/A  . Years of Education: N/A   Social History Main Topics  . Smoking status: Former Smoker    Quit date: 11/28/2010  . Smokeless tobacco: None  . Alcohol Use: No  . Drug Use: No  . Sexual Activity: Not Asked   Other Topics Concern  . None   Social History Narrative    Review of Systems: Unable to obtain due to cognitive status.   Physical Exam: BP 94/57 mmHg  Pulse 63  Temp(Src) 98.2 F (36.8 C) (Oral)  Ht 6' (1.829 m)  Wt 144 lb 9.6 oz (65.59 kg)  BMI 19.61 kg/m2 General:   Alert, unable to assess orientation Mouth:  Oral mucosa pink and moist.  Abdomen:  +BS, soft, non-tender and non-distended. No rebound or guarding. Limited exam with patient in chair. Unable to get on exam table. Bumper at 5cm.  Extremities:  Without edema. Psych:  Flat affect

## 2015-09-09 ENCOUNTER — Other Ambulatory Visit (HOSPITAL_COMMUNITY): Payer: Self-pay | Admitting: Geriatric Medicine

## 2015-09-09 DIAGNOSIS — R1310 Dysphagia, unspecified: Secondary | ICD-10-CM

## 2015-09-18 ENCOUNTER — Emergency Department (HOSPITAL_COMMUNITY): Payer: Medicare Other

## 2015-09-18 ENCOUNTER — Encounter (HOSPITAL_COMMUNITY): Payer: Self-pay | Admitting: *Deleted

## 2015-09-18 ENCOUNTER — Inpatient Hospital Stay (HOSPITAL_COMMUNITY)
Admission: EM | Admit: 2015-09-18 | Discharge: 2015-09-26 | DRG: 480 | Disposition: A | Discharge status: Skilled Nursing Facility | Payer: Medicare Other | Attending: Internal Medicine | Admitting: Internal Medicine

## 2015-09-18 DIAGNOSIS — E876 Hypokalemia: Secondary | ICD-10-CM | POA: Insufficient documentation

## 2015-09-18 DIAGNOSIS — Y92122 Bedroom in nursing home as the place of occurrence of the external cause: Secondary | ICD-10-CM

## 2015-09-18 DIAGNOSIS — S72009A Fracture of unspecified part of neck of unspecified femur, initial encounter for closed fracture: Secondary | ICD-10-CM

## 2015-09-18 DIAGNOSIS — D696 Thrombocytopenia, unspecified: Secondary | ICD-10-CM | POA: Diagnosis not present

## 2015-09-18 DIAGNOSIS — R6510 Systemic inflammatory response syndrome (SIRS) of non-infectious origin without acute organ dysfunction: Secondary | ICD-10-CM

## 2015-09-18 DIAGNOSIS — J9601 Acute respiratory failure with hypoxia: Secondary | ICD-10-CM | POA: Diagnosis present

## 2015-09-18 DIAGNOSIS — S7223XA Displaced subtrochanteric fracture of unspecified femur, initial encounter for closed fracture: Principal | ICD-10-CM | POA: Diagnosis present

## 2015-09-18 DIAGNOSIS — Z66 Do not resuscitate: Secondary | ICD-10-CM | POA: Diagnosis present

## 2015-09-18 DIAGNOSIS — Z931 Gastrostomy status: Secondary | ICD-10-CM

## 2015-09-18 DIAGNOSIS — S065X9A Traumatic subdural hemorrhage with loss of consciousness of unspecified duration, initial encounter: Secondary | ICD-10-CM | POA: Diagnosis present

## 2015-09-18 DIAGNOSIS — G629 Polyneuropathy, unspecified: Secondary | ICD-10-CM | POA: Diagnosis present

## 2015-09-18 DIAGNOSIS — W010XXA Fall on same level from slipping, tripping and stumbling without subsequent striking against object, initial encounter: Secondary | ICD-10-CM | POA: Diagnosis present

## 2015-09-18 DIAGNOSIS — I6203 Nontraumatic chronic subdural hemorrhage: Secondary | ICD-10-CM | POA: Diagnosis present

## 2015-09-18 DIAGNOSIS — I272 Other secondary pulmonary hypertension: Secondary | ICD-10-CM | POA: Diagnosis present

## 2015-09-18 DIAGNOSIS — Z87891 Personal history of nicotine dependence: Secondary | ICD-10-CM

## 2015-09-18 DIAGNOSIS — D649 Anemia, unspecified: Secondary | ICD-10-CM | POA: Diagnosis present

## 2015-09-18 DIAGNOSIS — F03918 Unspecified dementia, unspecified severity, with other behavioral disturbance: Secondary | ICD-10-CM | POA: Diagnosis present

## 2015-09-18 DIAGNOSIS — D62 Acute posthemorrhagic anemia: Secondary | ICD-10-CM | POA: Diagnosis not present

## 2015-09-18 DIAGNOSIS — Z01818 Encounter for other preprocedural examination: Secondary | ICD-10-CM

## 2015-09-18 DIAGNOSIS — Y95 Nosocomial condition: Secondary | ICD-10-CM

## 2015-09-18 DIAGNOSIS — E869 Volume depletion, unspecified: Secondary | ICD-10-CM | POA: Diagnosis not present

## 2015-09-18 DIAGNOSIS — R911 Solitary pulmonary nodule: Secondary | ICD-10-CM | POA: Diagnosis present

## 2015-09-18 DIAGNOSIS — F0391 Unspecified dementia with behavioral disturbance: Secondary | ICD-10-CM | POA: Diagnosis present

## 2015-09-18 DIAGNOSIS — A419 Sepsis, unspecified organism: Secondary | ICD-10-CM | POA: Diagnosis present

## 2015-09-18 DIAGNOSIS — I129 Hypertensive chronic kidney disease with stage 1 through stage 4 chronic kidney disease, or unspecified chronic kidney disease: Secondary | ICD-10-CM | POA: Diagnosis present

## 2015-09-18 DIAGNOSIS — R9389 Abnormal findings on diagnostic imaging of other specified body structures: Secondary | ICD-10-CM

## 2015-09-18 DIAGNOSIS — B952 Enterococcus as the cause of diseases classified elsewhere: Secondary | ICD-10-CM | POA: Diagnosis not present

## 2015-09-18 DIAGNOSIS — F329 Major depressive disorder, single episode, unspecified: Secondary | ICD-10-CM | POA: Diagnosis present

## 2015-09-18 DIAGNOSIS — J690 Pneumonitis due to inhalation of food and vomit: Secondary | ICD-10-CM | POA: Diagnosis present

## 2015-09-18 DIAGNOSIS — J189 Pneumonia, unspecified organism: Secondary | ICD-10-CM | POA: Insufficient documentation

## 2015-09-18 DIAGNOSIS — I959 Hypotension, unspecified: Secondary | ICD-10-CM | POA: Diagnosis not present

## 2015-09-18 DIAGNOSIS — R509 Fever, unspecified: Secondary | ICD-10-CM

## 2015-09-18 DIAGNOSIS — I69391 Dysphagia following cerebral infarction: Secondary | ICD-10-CM

## 2015-09-18 DIAGNOSIS — N189 Chronic kidney disease, unspecified: Secondary | ICD-10-CM | POA: Diagnosis present

## 2015-09-18 DIAGNOSIS — Z419 Encounter for procedure for purposes other than remedying health state, unspecified: Secondary | ICD-10-CM

## 2015-09-18 DIAGNOSIS — S065XAA Traumatic subdural hemorrhage with loss of consciousness status unknown, initial encounter: Secondary | ICD-10-CM | POA: Diagnosis present

## 2015-09-18 DIAGNOSIS — S72142A Displaced intertrochanteric fracture of left femur, initial encounter for closed fracture: Secondary | ICD-10-CM | POA: Diagnosis present

## 2015-09-18 DIAGNOSIS — N390 Urinary tract infection, site not specified: Secondary | ICD-10-CM | POA: Diagnosis not present

## 2015-09-18 HISTORY — DX: Contracture, unspecified hand: M24.549

## 2015-09-18 HISTORY — DX: Polyneuropathy, unspecified: G62.9

## 2015-09-18 HISTORY — DX: Cerebral infarction, unspecified: I63.9

## 2015-09-18 HISTORY — DX: Nontraumatic subdural hemorrhage, unspecified: I62.00

## 2015-09-18 HISTORY — DX: Other pulmonary embolism without acute cor pulmonale: I26.99

## 2015-09-18 HISTORY — DX: Essential (primary) hypertension: I10

## 2015-09-18 HISTORY — DX: Dysphagia, unspecified: R13.10

## 2015-09-18 HISTORY — DX: Disorder of kidney and ureter, unspecified: N28.9

## 2015-09-18 LAB — CBC WITH DIFFERENTIAL/PLATELET
Basophils Absolute: 0 10*3/uL (ref 0.0–0.1)
Basophils Relative: 0 %
EOS PCT: 0 %
Eosinophils Absolute: 0 10*3/uL (ref 0.0–0.7)
HEMATOCRIT: 39.3 % (ref 39.0–52.0)
Hemoglobin: 12.8 g/dL — ABNORMAL LOW (ref 13.0–17.0)
LYMPHS PCT: 3 %
Lymphs Abs: 0.4 10*3/uL — ABNORMAL LOW (ref 0.7–4.0)
MCH: 32.1 pg (ref 26.0–34.0)
MCHC: 32.6 g/dL (ref 30.0–36.0)
MCV: 98.5 fL (ref 78.0–100.0)
MONO ABS: 1.1 10*3/uL — AB (ref 0.1–1.0)
MONOS PCT: 8 %
NEUTROS ABS: 11.8 10*3/uL — AB (ref 1.7–7.7)
Neutrophils Relative %: 89 %
PLATELETS: 156 10*3/uL (ref 150–400)
RBC: 3.99 MIL/uL — ABNORMAL LOW (ref 4.22–5.81)
RDW: 13.5 % (ref 11.5–15.5)
WBC: 13.3 10*3/uL — ABNORMAL HIGH (ref 4.0–10.5)

## 2015-09-18 LAB — PROTIME-INR
INR: 1.14 (ref 0.00–1.49)
Prothrombin Time: 14.8 seconds (ref 11.6–15.2)

## 2015-09-18 LAB — I-STAT CG4 LACTIC ACID, ED: Lactic Acid, Venous: 2.26 mmol/L (ref 0.5–2.0)

## 2015-09-18 LAB — APTT: aPTT: 29 seconds (ref 24–37)

## 2015-09-18 MED ORDER — SODIUM CHLORIDE 0.9 % IV BOLUS (SEPSIS)
1000.0000 mL | Freq: Once | INTRAVENOUS | Status: AC
  Administered 2015-09-18: 1000 mL via INTRAVENOUS

## 2015-09-18 MED ORDER — SODIUM CHLORIDE 0.9 % IV SOLN
INTRAVENOUS | Status: DC
  Administered 2015-09-19: 01:00:00 via INTRAVENOUS

## 2015-09-18 MED ORDER — ACETAMINOPHEN 500 MG PO TABS
1000.0000 mg | ORAL_TABLET | Freq: Once | ORAL | Status: AC
  Administered 2015-09-18: 1000 mg
  Filled 2015-09-18: qty 2

## 2015-09-18 NOTE — ED Provider Notes (Addendum)
TIME SEEN: 11:15 PM  CHIEF COMPLAINT: Hip fracture  HPI: Pt is a 81 y.o. male with history of dementia, aspiration pneumonia who now has a PEG tube, prior stroke, hypertension, chronic kidney disease, DVT and pulmonary embolus who lives at [facility] who presents emergency department with his family for concerns for left hip fracture. Per family, patient is normally able to ambulate without a cane or walker and today he tripped over the sheets on his bed and fell on his left side. He did not hit his head per family but is unclear if this was truly witnessed by staff. He is not on anticoagulation. They report he is mostly nonverbal at baseline and is at his baseline currently. In the emergency department patient is febrile. They report he is a chronic cough which is unchanged. He reports frequent episodes of aspiration pneumonia. No vomiting or diarrhea.  PCP is Dr. Selena Batten   ROS: Level V caveat for dementia  PAST MEDICAL HISTORY/PAST SURGICAL HISTORY:  Past Medical History  Diagnosis Date  . Dementia   . Aspiration pneumonia (HCC)   . DVT (deep venous thrombosis) (HCC)   . Stroke (HCC)   . Hypertension   . Neuropathy (HCC)   . Contracture of hand joint   . Dysphagia   . Renal disorder   . Subdural hemorrhage (HCC)   . PE (pulmonary embolism)     MEDICATIONS:  Prior to Admission medications   Medication Sig Start Date End Date Taking? Authorizing Provider  Cholecalciferol (VITAMIN D) 2000 UNITS tablet Place 2,000 Units into feeding tube daily.   Yes Historical Provider, MD  esomeprazole (NEXIUM) 40 MG packet 40 mg by Gastric Tube route 2 (two) times daily.   Yes Historical Provider, MD  ipratropium-albuterol (DUONEB) 0.5-2.5 (3) MG/3ML SOLN Take 3 mLs by nebulization every 6 (six) hours as needed (FOR SHORTNESS OF BREATH/WHEEZING).    Yes Historical Provider, MD  LORazepam (ATIVAN) 0.5 MG tablet Take 0.5 mg by mouth as needed for anxiety.   Yes Historical Provider, MD   sertraline (ZOLOFT) 25 MG tablet 25 mg by Gastric Tube route daily.    Yes Historical Provider, MD  sterile water for irrigation 237 mL Give by tube 3 (three) times daily.   Yes Historical Provider, MD  valproate (DEPAKENE) 250 MG/5ML syrup 125 mg by Gastric Tube route 2 (two) times daily.    Yes Historical Provider, MD    ALLERGIES:  Allergies  Allergen Reactions  . No Known Allergies     SOCIAL HISTORY:  Social History  Substance Use Topics  . Smoking status: Former Smoker    Quit date: 11/28/2010  . Smokeless tobacco: Not on file  . Alcohol Use: No    FAMILY HISTORY: Family History  Problem Relation Age of Onset  . Colon cancer Neg Hx     EXAM: BP 96/67 mmHg  Pulse 104  Temp(Src) 100.9 F (38.3 C) (Rectal)  Resp 19  Ht  (1.803 m)  Wt 150 lb (68.04 kg)  BMI 20.93 kg/m2  SpO2 100% CONSTITUTIONAL: Alert, does not answer questions or follow commands, elderly, thin HEAD: Normocephalic, atraumatic EYES: Conjunctivae clear, PERRL ENT: normal nose; no rhinorrhea; moist mucous membranes; pharynx without lesions noted NECK: Supple, no meningismus, no LAD, no midline spinal tenderness or step-off or deformity  CARD: Regular and minimally tachycardic; S1 and S2 appreciated; no murmurs, no clicks, no rubs, no gallops, chest is nontender without crepitus, ecchymosis or deformity RESP: Normal chest excursion without splinting  or tachypnea; breath sounds clear and equal bilaterally; no wheezes, no rhonchi, no rales, no hypoxia or respiratory distress, speaking full sentences ABD/GI: Normal bowel sounds; non-distended; soft, non-tender, no rebound, no guarding, no peritoneal signs. PEG tube in place without surrounding erythema, warmth or drainage BACK:  The back appears normal and is non-tender to palpation, there is no CVA tenderness, no midline spinal tenderness or step-off or deformity EXT: Patient's left leg is shortened and internally rotated, no other sign of trauma over  his extremities, otherwise Normal ROM in all joints; otherwise extremities are non-tender to palpation; no edema; normal capillary refill; no cyanosis, no calf tenderness or swelling    SKIN: Normal color for age and race; warm NEURO: Moves all extremities equally, does not follow commands or answer questions which is baseline per family   MEDICAL DECISION MAKING: Patient here with fall out of bed. X-ray at nursing home showed hip fracture. No known head injury. He has a history of dementia and is at his neurologic baseline per family who is at bedside. He is also found to be febrile, mildly hypotensive and hypoxic. Concern for possible pneumonia. Nursing staff reports he was aspirating what appeared to be tube feedings in the emergency department. Airway suctioned. No respiratory distress. Doing well on 2 L of oxygen. Given he meets her criteria we'll start broad-spectrum antibiotics and IV fluids. Patient will need admission. Will repeat x-ray of his hip. We'll obtain labs, cultures, urine, chest x-ray.  ED PROGRESS: Patient has an elevated white count with left shift. Lactate is also elevated. Chest x-ray shows left basilar atelectasis which may be early pneumonia. Urine shows no sign of infection. Hip x-ray shows comminuted intertrochanteric left hip fracture. Blood pressure is improving with IV fluids. Discussed with Dr. Romeo Apple with orthopedics who will see patient in consult. Discussed with Dr. Sharl Ma with hospitalist service who agrees on admission to a stepdown bed. Updated patient's family at bedside.    4:00 AM  Head and cervical CT pending given fall.  Pt in stepdown unit.  Dr. Sharl Ma aware and will follow up.   EKG Interpretation  Date/Time:  Friday September 18 2015 22:51:14 EDT Ventricular Rate:  106 PR Interval:  156 QRS Duration: 84 QT Interval:  326 QTC Calculation: 433 R Axis:   23 Text Interpretation:  Sinus tachycardia Repol abnrm suggests ischemia, anterior leads No old tracing  to compare Confirmed by Horace Wishon,  DO, Lorilyn Laitinen ([id number]) on 09/19/2015 12:05:10 AM        CRITICAL CARE Performed by: Raelyn Number   Total critical care time: 35 minutes - SIRS, hypotension  Critical care time was exclusive of separately billable procedures and treating other patients.  Critical care was necessary to treat or prevent imminent or life-threatening deterioration.  Critical care was time spent personally by me on the following activities: development of treatment plan with patient and/or surrogate as well as nursing, discussions with consultants, evaluation of patient's response to treatment, examination of patient, obtaining history from patient or surrogate, ordering and performing treatments and interventions, ordering and review of laboratory studies, ordering and review of radiographic studies, pulse oximetry and re-evaluation of patient's condition.   Layla Maw Kenyana Husak, DO 09/19/15 0407  Layla Maw Sadat Sliwa, DO 09/19/15 0428   6:35 AM  Pt has a 6 mm subdural hematoma over the left parietal lobe that may be subacute in nature. No cervical spine fracture appreciated. Relayed these results to Dr. Sharl Ma with hospitalist service who will contact neurosurgery on  call.  Layla MawKristen N La Shehan, DO 09/19/15 [phone]

## 2015-09-18 NOTE — ED Notes (Signed)
Pt arrived to er from [facility] for further evaluation of hip fracture, ems reports that pt fell at 4pm today, had xray performed earlier in day with results of positive hip fracture, pt has hx of dementia, non verbal on arrival to er,

## 2015-09-19 ENCOUNTER — Inpatient Hospital Stay (HOSPITAL_COMMUNITY): Payer: Medicare Other

## 2015-09-19 DIAGNOSIS — F0391 Unspecified dementia with behavioral disturbance: Secondary | ICD-10-CM | POA: Diagnosis not present

## 2015-09-19 DIAGNOSIS — N390 Urinary tract infection, site not specified: Secondary | ICD-10-CM | POA: Diagnosis not present

## 2015-09-19 DIAGNOSIS — I517 Cardiomegaly: Secondary | ICD-10-CM | POA: Diagnosis not present

## 2015-09-19 DIAGNOSIS — I6200 Nontraumatic subdural hemorrhage, unspecified: Secondary | ICD-10-CM

## 2015-09-19 DIAGNOSIS — R6510 Systemic inflammatory response syndrome (SIRS) of non-infectious origin without acute organ dysfunction: Secondary | ICD-10-CM

## 2015-09-19 DIAGNOSIS — Z66 Do not resuscitate: Secondary | ICD-10-CM | POA: Diagnosis present

## 2015-09-19 DIAGNOSIS — W010XXA Fall on same level from slipping, tripping and stumbling without subsequent striking against object, initial encounter: Secondary | ICD-10-CM | POA: Diagnosis present

## 2015-09-19 DIAGNOSIS — F03918 Unspecified dementia, unspecified severity, with other behavioral disturbance: Secondary | ICD-10-CM | POA: Diagnosis present

## 2015-09-19 DIAGNOSIS — R911 Solitary pulmonary nodule: Secondary | ICD-10-CM | POA: Diagnosis present

## 2015-09-19 DIAGNOSIS — R938 Abnormal findings on diagnostic imaging of other specified body structures: Secondary | ICD-10-CM | POA: Diagnosis not present

## 2015-09-19 DIAGNOSIS — I69391 Dysphagia following cerebral infarction: Secondary | ICD-10-CM | POA: Diagnosis not present

## 2015-09-19 DIAGNOSIS — D62 Acute posthemorrhagic anemia: Secondary | ICD-10-CM | POA: Diagnosis not present

## 2015-09-19 DIAGNOSIS — I129 Hypertensive chronic kidney disease with stage 1 through stage 4 chronic kidney disease, or unspecified chronic kidney disease: Secondary | ICD-10-CM | POA: Diagnosis present

## 2015-09-19 DIAGNOSIS — S065X9A Traumatic subdural hemorrhage with loss of consciousness of unspecified duration, initial encounter: Secondary | ICD-10-CM | POA: Diagnosis present

## 2015-09-19 DIAGNOSIS — Y92122 Bedroom in nursing home as the place of occurrence of the external cause: Secondary | ICD-10-CM | POA: Diagnosis not present

## 2015-09-19 DIAGNOSIS — F329 Major depressive disorder, single episode, unspecified: Secondary | ICD-10-CM | POA: Diagnosis present

## 2015-09-19 DIAGNOSIS — Z87891 Personal history of nicotine dependence: Secondary | ICD-10-CM | POA: Diagnosis not present

## 2015-09-19 DIAGNOSIS — E869 Volume depletion, unspecified: Secondary | ICD-10-CM | POA: Diagnosis not present

## 2015-09-19 DIAGNOSIS — Z01818 Encounter for other preprocedural examination: Secondary | ICD-10-CM | POA: Diagnosis not present

## 2015-09-19 DIAGNOSIS — S72002A Fracture of unspecified part of neck of left femur, initial encounter for closed fracture: Secondary | ICD-10-CM | POA: Diagnosis not present

## 2015-09-19 DIAGNOSIS — A419 Sepsis, unspecified organism: Secondary | ICD-10-CM | POA: Diagnosis not present

## 2015-09-19 DIAGNOSIS — S72142A Displaced intertrochanteric fracture of left femur, initial encounter for closed fracture: Secondary | ICD-10-CM | POA: Diagnosis not present

## 2015-09-19 DIAGNOSIS — F0390 Unspecified dementia without behavioral disturbance: Secondary | ICD-10-CM

## 2015-09-19 DIAGNOSIS — E876 Hypokalemia: Secondary | ICD-10-CM | POA: Diagnosis not present

## 2015-09-19 DIAGNOSIS — I6203 Nontraumatic chronic subdural hemorrhage: Secondary | ICD-10-CM | POA: Diagnosis present

## 2015-09-19 DIAGNOSIS — J9601 Acute respiratory failure with hypoxia: Secondary | ICD-10-CM | POA: Diagnosis not present

## 2015-09-19 DIAGNOSIS — N189 Chronic kidney disease, unspecified: Secondary | ICD-10-CM | POA: Diagnosis present

## 2015-09-19 DIAGNOSIS — Z931 Gastrostomy status: Secondary | ICD-10-CM | POA: Diagnosis not present

## 2015-09-19 DIAGNOSIS — J690 Pneumonitis due to inhalation of food and vomit: Secondary | ICD-10-CM | POA: Diagnosis present

## 2015-09-19 DIAGNOSIS — J189 Pneumonia, unspecified organism: Secondary | ICD-10-CM | POA: Diagnosis not present

## 2015-09-19 DIAGNOSIS — S72142D Displaced intertrochanteric fracture of left femur, subsequent encounter for closed fracture with routine healing: Secondary | ICD-10-CM | POA: Diagnosis not present

## 2015-09-19 DIAGNOSIS — D696 Thrombocytopenia, unspecified: Secondary | ICD-10-CM | POA: Diagnosis not present

## 2015-09-19 DIAGNOSIS — I272 Other secondary pulmonary hypertension: Secondary | ICD-10-CM | POA: Diagnosis present

## 2015-09-19 DIAGNOSIS — S7223XA Displaced subtrochanteric fracture of unspecified femur, initial encounter for closed fracture: Secondary | ICD-10-CM | POA: Diagnosis present

## 2015-09-19 DIAGNOSIS — I959 Hypotension, unspecified: Secondary | ICD-10-CM | POA: Diagnosis not present

## 2015-09-19 DIAGNOSIS — S065XAA Traumatic subdural hemorrhage with loss of consciousness status unknown, initial encounter: Secondary | ICD-10-CM | POA: Diagnosis present

## 2015-09-19 DIAGNOSIS — B952 Enterococcus as the cause of diseases classified elsewhere: Secondary | ICD-10-CM | POA: Diagnosis not present

## 2015-09-19 DIAGNOSIS — G629 Polyneuropathy, unspecified: Secondary | ICD-10-CM | POA: Diagnosis present

## 2015-09-19 LAB — CBC
HCT: 35.4 % — ABNORMAL LOW (ref 39.0–52.0)
Hemoglobin: 11.3 g/dL — ABNORMAL LOW (ref 13.0–17.0)
MCH: 31.7 pg (ref 26.0–34.0)
MCHC: 31.9 g/dL (ref 30.0–36.0)
MCV: 99.2 fL (ref 78.0–100.0)
PLATELETS: 141 10*3/uL — AB (ref 150–400)
RBC: 3.57 MIL/uL — ABNORMAL LOW (ref 4.22–5.81)
RDW: 13 % (ref 11.5–15.5)
WBC: 9 10*3/uL (ref 4.0–10.5)

## 2015-09-19 LAB — COMPREHENSIVE METABOLIC PANEL
ALBUMIN: 4 g/dL (ref 3.5–5.0)
ALK PHOS: 113 U/L (ref 38–126)
ALT: 22 U/L (ref 17–63)
AST: 30 U/L (ref 15–41)
Anion gap: 8 (ref 5–15)
BUN: 22 mg/dL — ABNORMAL HIGH (ref 6–20)
CALCIUM: 9.4 mg/dL (ref 8.9–10.3)
CHLORIDE: 108 mmol/L (ref 101–111)
CO2: 24 mmol/L (ref 22–32)
CREATININE: 0.89 mg/dL (ref 0.61–1.24)
GFR calc Af Amer: >60 mL/min (ref >60)
GFR calc non Af Amer: >60 mL/min (ref >60)
GLUCOSE: 215 mg/dL — AB (ref 65–99)
Potassium: 3.9 mmol/L (ref 3.5–5.1)
SODIUM: 140 mmol/L (ref 135–145)
Total Bilirubin: 0.9 mg/dL (ref 0.3–1.2)
Total Protein: 7.2 g/dL (ref 6.5–8.1)

## 2015-09-19 LAB — MRSA PCR SCREENING: MRSA by PCR: POSITIVE — AB

## 2015-09-19 LAB — BASIC METABOLIC PANEL
ANION GAP: 7 (ref 5–15)
BUN: 22 mg/dL — ABNORMAL HIGH (ref 6–20)
CALCIUM: 9.1 mg/dL (ref 8.9–10.3)
CO2: 24 mmol/L (ref 22–32)
CREATININE: 0.77 mg/dL (ref 0.61–1.24)
Chloride: 111 mmol/L (ref 101–111)
GLUCOSE: 113 mg/dL — AB (ref 65–99)
Potassium: 4.3 mmol/L (ref 3.5–5.1)
Sodium: 142 mmol/L (ref 135–145)

## 2015-09-19 LAB — TYPE AND SCREEN
ABO/RH(D): A POS
Antibody Screen: NEGATIVE

## 2015-09-19 LAB — URINALYSIS, ROUTINE W REFLEX MICROSCOPIC
BILIRUBIN URINE: NEGATIVE
GLUCOSE, UA: NEGATIVE mg/dL
HGB URINE DIPSTICK: NEGATIVE
Leukocytes, UA: NEGATIVE
Nitrite: NEGATIVE
PROTEIN: NEGATIVE mg/dL
Specific Gravity, Urine: >1.03 — ABNORMAL HIGH (ref 1.005–1.030)
UROBILINOGEN UA: 0.2 mg/dL (ref 0.0–1.0)
pH: 5.5 (ref 5.0–8.0)

## 2015-09-19 LAB — GLUCOSE, CAPILLARY: GLUCOSE-CAPILLARY: 97 mg/dL (ref 65–99)

## 2015-09-19 MED ORDER — SODIUM CHLORIDE 0.9 % IV BOLUS (SEPSIS)
250.0000 mL | Freq: Once | INTRAVENOUS | Status: AC
  Administered 2015-09-19: 250 mL via INTRAVENOUS

## 2015-09-19 MED ORDER — SODIUM CHLORIDE 0.9 % IJ SOLN
10.0000 mL | Freq: Two times a day (BID) | INTRAMUSCULAR | Status: DC
  Administered 2015-09-19 – 2015-09-23 (×2): 10 mL
  Administered 2015-09-25: 40 mL

## 2015-09-19 MED ORDER — SODIUM CHLORIDE 0.9 % IJ SOLN
10.0000 mL | INTRAMUSCULAR | Status: DC | PRN
  Administered 2015-09-22 – 2015-09-24 (×2): 10 mL
  Filled 2015-09-19 (×2): qty 40

## 2015-09-19 MED ORDER — SODIUM CHLORIDE 0.9 % IV BOLUS (SEPSIS)
500.0000 mL | Freq: Once | INTRAVENOUS | Status: AC
Start: 2015-09-19 — End: 2015-09-19
  Administered 2015-09-19: 500 mL via INTRAVENOUS

## 2015-09-19 MED ORDER — VALPROATE SODIUM 250 MG/5ML PO SYRP
125.0000 mg | ORAL_SOLUTION | Freq: Two times a day (BID) | ORAL | Status: DC
  Administered 2015-09-19 – 2015-09-26 (×16): 125 mg via ORAL
  Filled 2015-09-19 (×2): qty 2.5
  Filled 2015-09-19 (×2): qty 5
  Filled 2015-09-19: qty 2.5
  Filled 2015-09-19: qty 5
  Filled 2015-09-19 (×2): qty 2.5
  Filled 2015-09-19 (×3): qty 5
  Filled 2015-09-19: qty 2.5
  Filled 2015-09-19: qty 5
  Filled 2015-09-19: qty 2.5
  Filled 2015-09-19: qty 5
  Filled 2015-09-19 (×2): qty 2.5
  Filled 2015-09-19 (×2): qty 5

## 2015-09-19 MED ORDER — VANCOMYCIN HCL IN DEXTROSE 750-5 MG/150ML-% IV SOLN
750.0000 mg | Freq: Two times a day (BID) | INTRAVENOUS | Status: DC
  Administered 2015-09-19 – 2015-09-23 (×9): 750 mg via INTRAVENOUS
  Filled 2015-09-19 (×12): qty 150

## 2015-09-19 MED ORDER — SERTRALINE HCL 25 MG PO TABS
25.0000 mg | ORAL_TABLET | Freq: Every day | ORAL | Status: DC
  Administered 2015-09-19 – 2015-09-26 (×8): 25 mg via ORAL
  Filled 2015-09-19 (×8): qty 1

## 2015-09-19 MED ORDER — LORAZEPAM 0.5 MG PO TABS
0.5000 mg | ORAL_TABLET | ORAL | Status: DC | PRN
  Administered 2015-09-21 – 2015-09-25 (×2): 0.5 mg via ORAL
  Filled 2015-09-19 (×2): qty 1

## 2015-09-19 MED ORDER — OSMOLITE 1.5 CAL PO LIQD
474.0000 mL | Freq: Four times a day (QID) | ORAL | Status: DC
  Administered 2015-09-20: 14:00:00
  Administered 2015-09-20: 474 mL
  Filled 2015-09-19 (×9): qty 1000

## 2015-09-19 MED ORDER — VALPROATE SODIUM 250 MG/5ML PO SYRP
ORAL_SOLUTION | ORAL | Status: AC
  Filled 2015-09-19: qty 5

## 2015-09-19 MED ORDER — PIPERACILLIN-TAZOBACTAM 3.375 G IVPB
3.3750 g | Freq: Three times a day (TID) | INTRAVENOUS | Status: DC
  Administered 2015-09-19 – 2015-09-24 (×14): 3.375 g via INTRAVENOUS
  Filled 2015-09-19 (×19): qty 50

## 2015-09-19 MED ORDER — CHLORHEXIDINE GLUCONATE CLOTH 2 % EX PADS
6.0000 | MEDICATED_PAD | Freq: Every day | CUTANEOUS | Status: AC
  Administered 2015-09-19 – 2015-09-23 (×5): 6 via TOPICAL

## 2015-09-19 MED ORDER — VANCOMYCIN HCL IN DEXTROSE 1-5 GM/200ML-% IV SOLN
1000.0000 mg | Freq: Once | INTRAVENOUS | Status: AC
  Administered 2015-09-19: 1000 mg via INTRAVENOUS
  Filled 2015-09-19: qty 200

## 2015-09-19 MED ORDER — PANTOPRAZOLE SODIUM 40 MG PO PACK
40.0000 mg | PACK | Freq: Two times a day (BID) | ORAL | Status: DC
  Administered 2015-09-19 – 2015-09-26 (×14): 40 mg via ORAL
  Filled 2015-09-19 (×16): qty 20

## 2015-09-19 MED ORDER — SODIUM CHLORIDE 0.9 % IV SOLN
INTRAVENOUS | Status: DC
  Administered 2015-09-19 – 2015-09-20 (×4): via INTRAVENOUS

## 2015-09-19 MED ORDER — JEVITY 1.2 CAL PO LIQD: 1000.0000 mL | ORAL | Status: DC

## 2015-09-19 MED ORDER — MUPIROCIN 2 % EX OINT
1.0000 "application " | TOPICAL_OINTMENT | Freq: Two times a day (BID) | CUTANEOUS | Status: AC
  Administered 2015-09-19 – 2015-09-23 (×10): 1 via NASAL
  Filled 2015-09-19 (×3): qty 22

## 2015-09-19 MED ORDER — MORPHINE SULFATE (PF) 2 MG/ML IV SOLN
0.5000 mg | INTRAVENOUS | Status: DC | PRN
  Administered 2015-09-19 – 2015-09-20 (×3): 0.5 mg via INTRAVENOUS
  Filled 2015-09-19 (×3): qty 1

## 2015-09-19 MED ORDER — HEPARIN SODIUM (PORCINE) 5000 UNIT/ML IJ SOLN
5000.0000 [IU] | Freq: Three times a day (TID) | INTRAMUSCULAR | Status: DC
  Filled 2015-09-19: qty 1

## 2015-09-19 MED ORDER — PIPERACILLIN-TAZOBACTAM 3.375 G IVPB
3.3750 g | Freq: Once | INTRAVENOUS | Status: AC
  Administered 2015-09-19: 3.375 g via INTRAVENOUS
  Filled 2015-09-19: qty 50

## 2015-09-19 MED ORDER — IPRATROPIUM-ALBUTEROL 0.5-2.5 (3) MG/3ML IN SOLN: 3.0000 mL | Freq: Four times a day (QID) | RESPIRATORY_TRACT | Status: DC | PRN

## 2015-09-19 MED ORDER — CETYLPYRIDINIUM CHLORIDE 0.05 % MT LIQD
7.0000 mL | Freq: Two times a day (BID) | OROMUCOSAL | Status: DC
  Administered 2015-09-19 – 2015-09-25 (×13): 7 mL via OROMUCOSAL

## 2015-09-19 MED ORDER — KCL IN DEXTROSE-NACL 20-5-0.9 MEQ/L-%-% IV SOLN
INTRAVENOUS | Status: DC
  Administered 2015-09-19: 11:00:00 via INTRAVENOUS

## 2015-09-19 MED ORDER — CHLORHEXIDINE GLUCONATE 0.12 % MT SOLN
15.0000 mL | Freq: Two times a day (BID) | OROMUCOSAL | Status: DC
  Administered 2015-09-19 – 2015-09-25 (×14): 15 mL via OROMUCOSAL
  Filled 2015-09-19 (×12): qty 15

## 2015-09-19 NOTE — Progress Notes (Signed)
Got a call from the ED physician Dr. Elesa MassedWard that patient CT head shows 6 mm subdural hematoma. Called neurosurgery on call Dr. Bevely Palmeritty who reviewed the CT head images, and says that hematoma appears chronic. No intervention required at this time. As per neurosurgery, if heparin or Lovenox needed for DVT prophylaxis, consider repeat CT head in 24 hours to check for stability of hematoma. Will discontinue heparin at this time, and start bilateral SCDs.  Called and discussed with patient's daughter Linus OrnMary Shaw, who says that patient fell 2 years ago and [facility], where he was diagnosed with subdural hematoma.

## 2015-09-19 NOTE — Progress Notes (Signed)
ANTIBIOTIC CONSULT NOTE - INITIAL  Pharmacy Consult for Vancomycin & Zosyn Indication: rule out sepsis  Allergies  Allergen Reactions  . No Known Allergies     Patient Measurements: Height: 5\' 11"  (180.3 cm) Weight: 141 lb 5 oz (64.1 kg) IBW/kg (Calculated) : 75.3 Adjusted Body Weight:   Vital Signs: Temp: 98.9 F (37.2 C) (10/22 0743) Temp Source: Axillary (10/22 0743) BP: 85/51 mmHg (10/22 0800) Pulse Rate: 78 (10/22 0800) Intake/Output from previous day: 10/21 0701 - 10/22 0700 In: 849.2 [I.V.:579.2; IV Piggyback:250] Out: 200 [Urine:200] Intake/Output from this shift:    Labs:  Recent Labs  09/18/15 2325 09/19/15 0429  WBC 13.3* 9.0  HGB 12.8* 11.3*  PLT 156 141*  CREATININE 0.89 0.77   Estimated Creatinine Clearance: 64.5 mL/min (by C-G formula based on Cr of 0.77). No results for input(s): VANCOTROUGH, VANCOPEAK, VANCORANDOM, GENTTROUGH, GENTPEAK, GENTRANDOM, TOBRATROUGH, TOBRAPEAK, TOBRARND, AMIKACINPEAK, AMIKACINTROU, AMIKACIN in the last 72 hours.   Microbiology: Recent Results (from the past 720 hour(s))  Blood culture (routine x 2)     Status: None (Preliminary result)   Collection Time: 09/19/15 12:09 AM  Result Value Ref Range Status   Specimen Description BLOOD LEFT ARM  Final   Special Requests BOTTLES DRAWN AEROBIC AND ANAEROBIC 8CC  Final   Culture NO GROWTH < 12 HOURS  Final   Report Status PENDING  Incomplete  Blood culture (routine x 2)     Status: None (Preliminary result)   Collection Time: 09/19/15 12:13 AM  Result Value Ref Range Status   Specimen Description BLOOD LEFT HAND  Final   Special Requests BOTTLES DRAWN AEROBIC AND ANAEROBIC 8CC  Final   Culture NO GROWTH < 12 HOURS  Final   Report Status PENDING  Incomplete  MRSA PCR Screening     Status: Abnormal   Collection Time: 09/19/15  1:51 AM  Result Value Ref Range Status   MRSA by PCR POSITIVE (A) NEGATIVE Final    Comment:        The GeneXpert MRSA Assay (FDA approved for  NASAL specimens only), is one component of a comprehensive MRSA colonization surveillance program. It is not intended to diagnose MRSA infection nor to guide or monitor treatment for MRSA infections. RESULT CALLED TO, READ BACK BY AND VERIFIED WITH:  HAMMOCK,S @ 0455 ON 09/19/15 BY WOODIE,J     Medical History: Past Medical History  Diagnosis Date  . Dementia   . Aspiration pneumonia (HCC)   . DVT (deep venous thrombosis) (HCC)   . Stroke (HCC)   . Hypertension   . Neuropathy (HCC)   . Contracture of hand joint   . Dysphagia   . Renal disorder   . Subdural hemorrhage (HCC)   . PE (pulmonary embolism)     Medications:  Scheduled:  . antiseptic oral rinse  7 mL Mouth Rinse q12n4p  . chlorhexidine  15 mL Mouth Rinse BID  . Chlorhexidine Gluconate Cloth  6 each Topical Q0600  . mupirocin ointment  1 application Nasal BID  . pantoprazole sodium  40 mg Oral BID AC  . piperacillin-tazobactam (ZOSYN)  IV  3.375 g Intravenous Q8H  . sertraline  25 mg Oral Daily  . sodium chloride  500 mL Intravenous Once  . valproic acid  125 mg Oral BID  . vancomycin  750 mg Intravenous Q12H   Assessment: Patient presenting with fever, hypotension, tachycardia, leukocytosis, and lactate 2.26. No clear source of infection. Chest x-ray is clear, UA shows no infection. Patient  empirically started on vancomycin and Zosyn due to possibility of aspiration. Zosyn 3.375 GM and Vancomycin 1 GM IV given in ED 0016  Goal of Therapy:  Vancomycin trough level 15-20 mcg/ml  Plan:  Zosyn 3.375 GM IV every 8 hours, each dose over 4 hours Vancomycin 750 mg IV every 12 hours Vancomycin trough at steady state Monitor renal function Labs per protocol  Juan Owens, Juan Owens 09/19/2015,9:01 AM

## 2015-09-19 NOTE — Progress Notes (Addendum)
Initial Nutrition Assessment  DOCUMENTATION CODES:  Not applicable  INTERVENTION:  Per [facility]:  Pt has been tried on continuous feeds-Does NOT tolerate  Pt MUST be upright (90) when administering bolus  Pt may start coughing with administration in which case bolus infusion must be slowed down  When medically able, recommend similar TF regimen to patients normal.  Bolus TF: 2 can Osmolite 1.5 q 6 hours via PEG w/ 200 ml flush q 4 hours. 30 mls before and after meds.   Tube feeding regimen provides 2844 kcal, 119.2 grams of protein, and 1448 ml of H2O.   Suspect pt's past issues with TF intolerance may be related to overfeeding w/ high fat formula. He may benefit from lower kcal formula. Will wait until can speak with [facility] nursing and pt can be physically assessed before making TF chanegs  NUTRITION DIAGNOSIS:  Inadequate oral intake related to inability to eat as evidenced by NPO status.  GOAL:  Patient will meet greater than or equal to 90% of their needs  MONITOR:  Labs, I & O's, Weight trends, TF tolerance  REASON FOR ASSESSMENT:  Consult Enteral/tube feeding initiation and management  ASSESSMENT:  81 y.o. Male PMHx dementia, aspiration pneumonia -PEG dependant, prior stroke, HTN, CKD, DVT and PE who [facility]. Pt presents after falling at nursing home and suffering a left hip fx.   RD operating remotely. There was some concern for aspiration as pt had vomiting with tube feeding today. However, cxr did not show any PNA.   Called [facility] and received pts normal tube feeding regimen: TwoCal HN- 1 can q 4 hours, 30 mls before and after meds and every 4 hours he gets 300 ml extra.   [facility] nursing reports that pt MUST stay upright with his feeding.  Nursing reports that pt was on continuous feeding on the past, but did not tolerate. W/ Bolus feeds, nursing carefully watches him while administering feeds. If pt starts coughing, they will slow down the  bolus infusion. Nursing reports that he is at high risk for aspiration.   This TF regimen provides: 2844 kcals, 119.4 g Pro, 996 ml fluid (+1800 ml from flushes)   Pt is chronic NPO at facility. But when family takes pt out, they do feed him orally.   Pts TF regimen would seem to substantially overfeed him. Question if his reported issues of TF intolerance are related to overfeeding with a much higher fat formula. However, will not attempt any changes until better assessment can be done.   Diet Order:  Diet NPO time specified  Skin:  Reviewed, no issues  Last BM:  Unknown  Height:  Ht Readings from Last 1 Encounters:  09/19/15 5\' 11"  (1.803 m)   Weight:  Wt Readings from Last 1 Encounters:  09/19/15 141 lb 5 oz (64.1 kg)   Wt Readings from Last 10 Encounters:  09/19/15 141 lb 5 oz (64.1 kg)  09/03/15 144 lb 9.6 oz (65.59 kg)  05/09/15 160 lb (72.576 kg)  03/05/15 154 lb 9.6 oz (70.126 kg)   Ideal Body Weight:  78.18 kg  BMI:  Body mass index is 19.72 kg/(m^2).  Estimated Nutritional Needs:  Kcal:  1900-2250 kcals (30-35 kcal/kg) Protein:  94-110 g Pro (1.2-1.4 g/kg bw) Fluid:  1.9-2.1 liters  EDUCATION NEEDS:  No education needs identified at this time  Christophe Louisathan Mazin Emma RD, LDN Nutrition Pager: [phone] 09/19/2015 8:49 AM

## 2015-09-19 NOTE — Progress Notes (Signed)
Patient is an 81 year old man with a history of dementia with behavioral disturbances, previous stroke with resultant dysphagia, PEG tube dependent, previous subdural hematoma following a fall, DVT/PE-not on anticoagulation, and hypertension. He was admitted by Dr. Sharl MaLama this morning for a fall at the SNF. X-ray reveals comminuted intertrochanteric left hip fracture. Chest x-ray not indicative of pneumonia, but aspiration pneumonia is suspected. Antibiotics were started.  Patient was briefly seen and examined. His chart, vital signs, laboratory studies were reviewed. Agree with management as ordered with additions below.  -We'll give patient another bolus of IV fluids and restart IV fluids at a slightly higher rate; given that his systolic blood pressures in the 80s. This will be with caution, given that the patient has a history of CHF. EF is unknown and the type of CHF is unknown.-We'll hold Lasix for now. -Will start tube feedings tomorrow if no further signs of vomiting.

## 2015-09-19 NOTE — Progress Notes (Signed)
TELEPHONE CONSENT OBTAINED FROM DAUGHTER FOR PT TO HAVE A PICC LINE INSERTED

## 2015-09-19 NOTE — H&P (Signed)
PCP:   Pearson Grippe, MD   Chief Complaint:   Fall  HPI:  81 year old male who  has a past medical history of Dementia; Aspiration pneumonia (HCC); DVT (deep venous thrombosis) (HCC); Stroke [facility]); Hypertension; Neuropathy (HCC); Contracture of hand joint; Dysphagia; Renal disorder; Subdural hemorrhage (HCC); and PE (pulmonary embolism). Reverse brought to the ED from [facility] skilled nursing facility after a fall. Patient is nonverbal at baseline due to dementia, and as per daughter patient got up from the bed and tripped, leading to fall and left hip fracture. There is no history of passing out. As patient is nonverbal no other review of systems obtainable. There is no history of CAD or stroke. As per daughter and son-in-law, patient is very active and ambulatory.  He has a PEG tube in place, and was noted to have vomiting with tube feeding today, raising the possibility of aspiration. Empirically started on vancomycin and Zosyn in the ED. Chest x-ray does not show pneumonia. WBC 13.3, lactate 2.26.  Allergies:   Allergies  Allergen Reactions  . No Known Allergies       Past Medical History  Diagnosis Date  . Dementia   . Aspiration pneumonia (HCC)   . DVT (deep venous thrombosis) (HCC)   . Stroke (HCC)   . Hypertension   . Neuropathy (HCC)   . Contracture of hand joint   . Dysphagia   . Renal disorder   . Subdural hemorrhage (HCC)   . PE (pulmonary embolism)     Past Surgical History  Procedure Laterality Date  . Ivc filter    . Peg placement  April 2014    Dr. Michaell Cowing at [facility]  . Peg placement N/A 07/02/2015    Procedure: PERCUTANEOUS ENDOSCOPIC GASTROSTOMY (PEG) REPLACEMENT;  Surgeon: West Bali, MD;  Location: [facility] ENDO SUITE;  Service: Endoscopy;  Laterality: N/A;    Prior to Admission medications   Medication Sig Start Date End Date Taking? Authorizing Provider  Cholecalciferol (VITAMIN D) 2000 UNITS tablet Place 2,000 Units into feeding tube daily.   Yes  Historical Provider, MD  esomeprazole (NEXIUM) 40 MG packet 40 mg by Gastric Tube route 2 (two) times daily.   Yes Historical Provider, MD  ipratropium-albuterol (DUONEB) 0.5-2.5 (3) MG/3ML SOLN Take 3 mLs by nebulization every 6 (six) hours as needed (FOR SHORTNESS OF BREATH/WHEEZING).    Yes Historical Provider, MD  LORazepam (ATIVAN) 0.5 MG tablet Take 0.5 mg by mouth as needed for anxiety.   Yes Historical Provider, MD  sertraline (ZOLOFT) 25 MG tablet 25 mg by Gastric Tube route daily.    Yes Historical Provider, MD  sterile water for irrigation 237 mL Give by tube 3 (three) times daily.   Yes Historical Provider, MD  valproate (DEPAKENE) 250 MG/5ML syrup 125 mg by Gastric Tube route 2 (two) times daily.    Yes Historical Provider, MD    Social History:  reports that he quit smoking about 4 years ago. He does not have any smokeless tobacco history on file. He reports that he does not drink alcohol or use illicit drugs.  Family History  Problem Relation Age of Onset  . Colon cancer Neg Hx     Filed Weights   09/18/15 2236  Weight: 68.04 kg (150 lb)      Review of Systems:  Unobtainable as patient is not verbal   Physical Exam: Blood pressure 111/71, pulse 89, temperature 100.9 F (38.3 C), temperature source Rectal, resp. rate 17, height  (  1.803 m), weight 68.04 kg (150 lb), SpO2 98 %. Constitutional:   Patient is a well-developed and well-nourished male* in no acute distress and cooperative with exam. Head: Normocephalic and atraumatic Mouth: Mucus membranes moist Neck: Supple, No Thyromegaly Cardiovascular: RRR, S1 normal, S2 normal Pulmonary/Chest: CTAB, no wheezes, rales, or rhonchi Abdominal: Soft. Non-tender, non-distended, bowel sounds are normal, no masses, organomegaly, or guarding present.  Neurological: Alert but not oriented 3 .  Extremities : No cyanosis/clubbing/edema of the lower extremities  Labs on Admission:  Basic Metabolic Panel:  Recent  Labs Lab 09/18/15 2325  NA 140  K 3.9  CL 108  CO2 24  GLUCOSE 215*  BUN 22*  CREATININE 0.89  CALCIUM 9.4   Liver Function Tests:  Recent Labs Lab 09/18/15 2325  AST 30  ALT 22  ALKPHOS 113  BILITOT 0.9  PROT 7.2  ALBUMIN 4.0   CBC:  Recent Labs Lab 09/18/15 2325  WBC 13.3*  NEUTROABS 11.8*  HGB 12.8*  HCT 39.3  MCV 98.5  PLT 156   Radiological Exams on Admission: Dg Chest Portable 1 View  09/19/2015  CLINICAL DATA:  Hip fracture, preop. EXAM: PORTABLE CHEST 1 VIEW COMPARISON:  None. FINDINGS: The patient is rotated. Heart at the upper limits of normal in size. There is tortuosity of the thoracic aorta. Mild bilateral hilar prominence. Lungs symmetrically inflated. Mild atelectasis at the left lung base. No large pleural effusion, pneumothorax or pulmonary edema. Ballistic debris projects in the region of the left supraclavicular tissues. Multiple overlying monitoring devices in place. No acute osseous abnormalities are seen. IMPRESSION: 1. Borderline cardiomegaly with tortuous thoracic aorta. Bilateral hilar prominence may reflect underlying pulmonary arterial hypertension, however adenopathy could have a similar appearance. There are no prior exams available for comparison. 2. Mild left basilar atelectasis. Electronically Signed   By: Rubye Oaks M.D.   On: 09/19/2015 00:12   Dg Hip Port Unilat With Pelvis 1v Left  09/19/2015  CLINICAL DATA:  Hip fracture, dementia EXAM: DG HIP (WITH OR WITHOUT PELVIS) 1V PORT LEFT COMPARISON:  None. FINDINGS: Intertrochanteric left hip fracture, comminuted with a displaced lesser trochanteric fragment. Varus angulation. Foreshortening. Visualized bony pelvis appears intact. IVC filter. IMPRESSION: Comminuted, intertrochanteric left hip fracture, as above. Electronically Signed   By: Charline Bills M.D.   On: 09/19/2015 00:11    EKG: Independently reviewed. Sinus tachycardia   Assessment/Plan Active Problems:   SIRS  (systemic inflammatory response syndrome) (HCC)   Hip fracture (HCC)   Dementia    SIRS Patient presenting with fever, hypotension, tachycardia, leukocytosis, and lactate 2.26. No clear source of infection. Chest x-ray is clear, UA shows no infection. Patient empirically started on vancomycin and Zosyn due to possibility of aspiration. Repeat chest x-ray in a.m. the patient nothing by mouth, will hold the tube feedings at this time.  Hip fracture Patient has comminuted intertrochanteric left hip fracture, orthopedic surgery has been consulted by the ED physician. Will initiate hip fracture protocol.  Hypoxia Patient had O2 sats in the 80s, requiring oxygen, with oxygen O2 sats improved 100% on 2 L nasal cannula. Chest x-ray shows are demonstrated possibility of pulmonary hypertension. We'll obtain echocardiogram in a.m.  Dementia without behavioral disturbance Continue valproate twice a day Ativan when necessary  DVT prophylaxis Heparin   Code status: DO NOT RESUSCITATE  Family discussion: Admission, patients condition and plan of care including tests being ordered have been discussed with the patient's daughter and son-in-law at bedside* who indicate understanding and  agree with the plan and Code Status.   Time Spent on Admission: 60 min  Namrata Dangler S [facility] Pager: [phone] 09/19/2015, 1:53 AM  If 7PM-7AM, please contact night-coverage  [email or web address]  Password [email or web address]

## 2015-09-19 NOTE — Progress Notes (Signed)
CRITICAL VALUE ALERT  Critical value received:  MRSA PCR positive  Date of notification:  09/19/2015  Time of notification:  0534  Critical value read back:Yes.    Nurse who received alert:  Lionel DecemberS Toua Stites, RN  MD notified (1st page):  Dr Sharl MaLama  Time of first page:  0534  MD notified (2nd page):   Time of second page:  Responding MD:  Dr Sharl MaLama  Time MD responded:  [phone]  Orders placed on the chart for MRSA protocol

## 2015-09-19 NOTE — Progress Notes (Signed)
Peripherally Inserted Central Catheter/Midline Placement  The IV Nurse has discussed with the patient and/or persons authorized to consent for the patient, the purpose of this procedure and the potential benefits and risks involved with this procedure.  The benefits include less needle sticks, lab draws from the catheter and patient may be discharged home with the catheter.  Risks include, but not limited to, infection, bleeding, blood clot (thrombus formation), and puncture of an artery; nerve damage and irregular heat beat.  Alternatives to this procedure were also discussed.  PICC/Midline Placement Documentation  PICC / Midline Double Lumen 09/19/15 PICC Right Basilic 38 cm 0 cm (Active)  Indication for Insertion or Continuance of Line Limited venous access - need for IV therapy >5 days (PICC only) 09/19/2015  3:38 PM  Exposed Catheter (cm) 0 cm 09/19/2015  3:38 PM  Site Assessment Clean;Dry;Intact 09/19/2015  3:38 PM  Lumen #1 Status Flushed;Blood return noted 09/19/2015  3:38 PM  Lumen #2 Status Flushed;Blood return noted 09/19/2015  3:38 PM  Dressing Type Transparent 09/19/2015  3:38 PM  Dressing Status Clean;Dry;Intact 09/19/2015  3:38 PM  Line Care Connections checked and tightened 09/19/2015  3:38 PM  Dressing Intervention New dressing 09/19/2015  3:38 PM  Dressing Change Due 09/26/15 09/19/2015  3:38 PM       Daleen SquibbGibson, Brinn Westby Lynn 09/19/2015, 3:39 PM

## 2015-09-19 NOTE — Progress Notes (Signed)
CRITICAL VALUE ALERT  Critical value received:  6mm subdural hematoma found on head CT  Date of notification:  09/19/2015  Time of notification:  0630  Critical value read back:Yes.    Nurse who received alert:  Lionel DecemberS Donabelle Molden, RN  MD notified (1st page):  Ward and [place]  Time of first page:  0632  MD notified (2nd page):  Time of second page:  Responding MD:  [place]  Time MD responded:  [phone]  Orders entered on the chart from Dr Sharl MaLama; message left in ED for Dr Elesa MassedWard of CT results since she was the ordering physician

## 2015-09-19 NOTE — Consult Note (Signed)
Patient ID: Juan Owens, male   DOB: 11-05-1931, 81 y.o.   MRN: [id number]  CONSULT [id number]  Chief Complaint  Patient presents with  . Hip Injury    HPI Juan Owens is a 81 y.o. male.  He doesn't talk since he had a stroke. History as noted by Dr Sharl Ma  HPI:  81 year old male who  has a past medical history of Dementia; Aspiration pneumonia (HCC); DVT (deep venous thrombosis) (HCC); Stroke [facility]); Hypertension; Neuropathy (HCC); Contracture of hand joint; Dysphagia; Renal disorder; Subdural hemorrhage (HCC); and PE (pulmonary embolism). Reverse brought to the ED from [facility] facility after a fall. Patient is nonverbal at baseline due to dementia, and as per daughter patient got up from the bed and tripped, leading to fall and left hip fracture. There is no history of passing out. As patient is nonverbal no other review of systems obtainable. There is no history of CAD or stroke. As per daughter and son-in-law, patient is very active and ambulatory.   He has a PEG tube in place, and was noted to have vomiting with tube feeding today, raising the possibility of aspiration. Empirically started on vancomycin and Zosyn in the ED. Chest x-ray does not show pneumonia. WBC 13.3, lactate 2.26.  Review of Systems Review of Systems  Unable to perform ROS: Other  aphasia   Past Medical History  Diagnosis Date  . Dementia   . Aspiration pneumonia (HCC)   . DVT (deep venous thrombosis) (HCC)   . Stroke (HCC)   . Hypertension   . Neuropathy (HCC)   . Contracture of hand joint   . Dysphagia   . Renal disorder   . Subdural hemorrhage (HCC)   . PE (pulmonary embolism)     Past Surgical History  Procedure Laterality Date  . Ivc filter    . Peg placement  April 2014    Dr. Michaell Cowing at [facility]  . Peg placement N/A 07/02/2015    Procedure: PERCUTANEOUS ENDOSCOPIC GASTROSTOMY (PEG) REPLACEMENT;  Surgeon: West Bali, MD;  Location: [facility];  Service: Endoscopy;  Laterality: N/A;     Social History Social History  Substance Use Topics  . Smoking status: Former Smoker    Quit date: 11/28/2010  . Smokeless tobacco: None  . Alcohol Use: No    Allergies  Allergen Reactions  . No Known Allergies     Current Facility-Administered Medications  Medication Dose Route Frequency Provider Last Rate Last Dose  . 0.9 %  sodium chloride infusion   Intravenous Continuous Elliot Cousin, MD      . antiseptic oral rinse (CPC / CETYLPYRIDINIUM CHLORIDE 0.05%) solution 7 mL  7 mL Mouth Rinse q12n4p Elliot Cousin, MD      . chlorhexidine (PERIDEX) 0.12 % solution 15 mL  15 mL Mouth Rinse BID Elliot Cousin, MD   15 mL at 09/19/15 0848  . Chlorhexidine Gluconate Cloth 2 % PADS 6 each  6 each Topical Q0600 Meredeth Ide, MD   6 each at 09/19/15 0645  . dextrose 5 % and 0.9 % NaCl with KCl 20 mEq/L infusion   Intravenous Continuous Elliot Cousin, MD      . feeding supplement (OSMOLITE 1.5 CAL) liquid 474 mL  474 mL Per Tube QID Vedia Coffer, RD      . ipratropium-albuterol (DUONEB) 0.5-2.5 (3) MG/3ML nebulizer solution 3 mL  3 mL Nebulization Q6H PRN Meredeth Ide, MD      . LORazepam (ATIVAN) tablet 0.5 mg  0.5 mg Oral PRN Meredeth IdeGagan S Lama, MD      . morphine 2 MG/ML injection 0.5 mg  0.5 mg Intravenous Q2H PRN Meredeth IdeGagan S Lama, MD   0.5 mg at 09/19/15 0845  . mupirocin ointment (BACTROBAN) 2 % 1 application  1 application Nasal BID Meredeth IdeGagan S Lama, MD   1 application at 09/19/15 [phone]  . pantoprazole sodium (PROTONIX) 40 mg/20 mL oral suspension 40 mg  40 mg Oral BID AC Meredeth IdeGagan S Lama, MD      . piperacillin-tazobactam (ZOSYN) IVPB 3.375 g  3.375 g Intravenous Q8H Elliot Cousinenise Fisher, MD      . sertraline (ZOLOFT) tablet 25 mg  25 mg Oral Daily Meredeth IdeGagan S Lama, MD      . sodium chloride 0.9 % bolus 500 mL  500 mL Intravenous Once Elliot Cousinenise Fisher, MD      . valproic acid (DEPAKENE) 250 MG/5ML syrup 125 mg  125 mg Oral BID Meredeth IdeGagan S Lama, MD   125 mg at 09/19/15 0340  . vancomycin (VANCOCIN) IVPB 750 mg/150 ml  premix  750 mg Intravenous Q12H Elliot Cousinenise Fisher, MD          Physical Exam Physical Exam Blood pressure 112/65, pulse 76, temperature 98.9 F (37.2 C), temperature source Axillary, resp. rate 15, height 5\' 11"  (1.803 m), weight 141 lb 5 oz (64.1 kg), SpO2 98 %.  Gen. appearance no gross deformity The patient is not alert or oriented ; somnolent  Mood can not determine  Exam of the left leg  Inspection hip flexed and knee flexed, pain to palpation left hip area ROM deferred, to avoid causing pain Stability deferred testing at hip knee and ankle without subluxation Strength muscle tone normal   Skin: right thigh looks like old surgical incision  Pulses: left /  Right foot intact   Neuro: could not assess    MEDICAL DECISION MAKING (min = low)  Data Reviewed H/p xrays I interpret as low cervical fracture of right hip (basicervical)  Assessment    Left hip fracture, needs surgical repair.  [facility]: 1. Prior subdural chronic at high risk for 2nd event without neurosurgery here 2. High risk intraop 3. High risk post op / need SICU  4. Doubt will not be cleared by anesthesia dept      Plan    Stabilize medically then transfer , let me know when transferring and I ll alert ortho on call        Juan Owens 09/19/2015, 10:16 AM

## 2015-09-20 ENCOUNTER — Inpatient Hospital Stay (HOSPITAL_COMMUNITY): Payer: Medicare Other

## 2015-09-20 DIAGNOSIS — D696 Thrombocytopenia, unspecified: Secondary | ICD-10-CM

## 2015-09-20 DIAGNOSIS — I272 Other secondary pulmonary hypertension: Secondary | ICD-10-CM

## 2015-09-20 DIAGNOSIS — Z931 Gastrostomy status: Secondary | ICD-10-CM

## 2015-09-20 DIAGNOSIS — D649 Anemia, unspecified: Secondary | ICD-10-CM

## 2015-09-20 LAB — BASIC METABOLIC PANEL
ANION GAP: 6 (ref 5–15)
BUN: 11 mg/dL (ref 6–20)
CALCIUM: 8.3 mg/dL — AB (ref 8.9–10.3)
CO2: 24 mmol/L (ref 22–32)
CREATININE: 0.64 mg/dL (ref 0.61–1.24)
Chloride: 115 mmol/L — ABNORMAL HIGH (ref 101–111)
GFR calc Af Amer: >60 mL/min (ref >60)
GFR calc non Af Amer: >60 mL/min (ref >60)
GLUCOSE: 107 mg/dL — AB (ref 65–99)
Potassium: 3.6 mmol/L (ref 3.5–5.1)
Sodium: 145 mmol/L (ref 135–145)

## 2015-09-20 LAB — CBC
HCT: 29.4 % — ABNORMAL LOW (ref 39.0–52.0)
Hemoglobin: 9.5 g/dL — ABNORMAL LOW (ref 13.0–17.0)
MCH: 32.2 pg (ref 26.0–34.0)
MCHC: 32.3 g/dL (ref 30.0–36.0)
MCV: 99.7 fL (ref 78.0–100.0)
PLATELETS: 112 10*3/uL — AB (ref 150–400)
RBC: 2.95 MIL/uL — AB (ref 4.22–5.81)
RDW: 13.9 % (ref 11.5–15.5)
WBC: 7.3 10*3/uL (ref 4.0–10.5)

## 2015-09-20 LAB — COMPREHENSIVE METABOLIC PANEL
ALK PHOS: 73 U/L (ref 38–126)
ALT: 14 U/L — AB (ref 17–63)
AST: 25 U/L (ref 15–41)
Albumin: 2.9 g/dL — ABNORMAL LOW (ref 3.5–5.0)
Anion gap: 5 (ref 5–15)
BUN: 13 mg/dL (ref 6–20)
CALCIUM: 8.4 mg/dL — AB (ref 8.9–10.3)
CO2: 24 mmol/L (ref 22–32)
CREATININE: 0.7 mg/dL (ref 0.61–1.24)
Chloride: 113 mmol/L — ABNORMAL HIGH (ref 101–111)
Glucose, Bld: 109 mg/dL — ABNORMAL HIGH (ref 65–99)
Potassium: 3.6 mmol/L (ref 3.5–5.1)
Sodium: 142 mmol/L (ref 135–145)
Total Bilirubin: 1.1 mg/dL (ref 0.3–1.2)
Total Protein: 5.6 g/dL — ABNORMAL LOW (ref 6.5–8.1)

## 2015-09-20 LAB — GLUCOSE, CAPILLARY
GLUCOSE-CAPILLARY: 101 mg/dL — AB (ref 65–99)
GLUCOSE-CAPILLARY: 89 mg/dL (ref 65–99)
GLUCOSE-CAPILLARY: 92 mg/dL (ref 65–99)
GLUCOSE-CAPILLARY: 93 mg/dL (ref 65–99)
Glucose-Capillary: 100 mg/dL — ABNORMAL HIGH (ref 65–99)
Glucose-Capillary: 90 mg/dL (ref 65–99)
Glucose-Capillary: 102 mg/dL — ABNORMAL HIGH (ref 65–99)

## 2015-09-20 MED ORDER — IOHEXOL 300 MG/ML  SOLN
75.0000 mL | Freq: Once | INTRAMUSCULAR | Status: AC | PRN
  Administered 2015-09-20: 100 mL via INTRAVENOUS

## 2015-09-20 MED ORDER — FREE WATER
100.0000 mL | Freq: Three times a day (TID) | Status: DC
  Administered 2015-09-20 – 2015-09-26 (×14): 100 mL

## 2015-09-20 MED ORDER — OSMOLITE 1.5 CAL PO LIQD
474.0000 mL | Freq: Four times a day (QID) | ORAL | Status: DC
  Administered 2015-09-23 (×2): 474 mL
  Filled 2015-09-20 (×12): qty 474

## 2015-09-20 MED ORDER — IPRATROPIUM-ALBUTEROL 0.5-2.5 (3) MG/3ML IN SOLN
3.0000 mL | Freq: Four times a day (QID) | RESPIRATORY_TRACT | Status: DC
  Administered 2015-09-20 (×2): 3 mL via RESPIRATORY_TRACT
  Filled 2015-09-20 (×2): qty 3

## 2015-09-20 MED ORDER — IPRATROPIUM-ALBUTEROL 0.5-2.5 (3) MG/3ML IN SOLN: 3.0000 mL | Freq: Four times a day (QID) | RESPIRATORY_TRACT | Status: DC | PRN

## 2015-09-20 MED ORDER — ACETAMINOPHEN 325 MG PO TABS
650.0000 mg | ORAL_TABLET | Freq: Four times a day (QID) | ORAL | Status: DC | PRN
  Administered 2015-09-20: 650 mg via ORAL
  Filled 2015-09-20: qty 2

## 2015-09-20 NOTE — Progress Notes (Signed)
Patient placed on stretcher, O2, and cardiac monitor, and transported by [facility] to [facility]

## 2015-09-20 NOTE — Progress Notes (Signed)
Addendum:  Please see previous progress note for details.  I discussed the patient with orthopedic surgeon, Dr. Romeo AppleHarrison. He believed that the patient had too many comorbidities and was too high risk to have left hip surgery performed here. Therefore, he discussed the patient with orthopedic surgeon, Dr. Magnus IvanBlackman, at [facility]. Dr. Magnus IvanBlackman agreed to see the patient in consultation. I have discussed the patient with hospitalist, Dr. Thedore MinsSingh. He has agreed to accept the patient in transfer. The patient's daughter, Dr. Clelia CroftShaw was informed of the transfer.  Follow-up assessment of the patient: He is stable. He is oxygenating 97% on 3 L of oxygen. His blood pressure is 97/72. His lungs are clear anteriorly with decreased breath sounds in the bases. CT of the chest with contrast pending to evaluate for possible adenopathy.

## 2015-09-20 NOTE — Progress Notes (Signed)
Utilization review Completed Glendell Fouse RN BSN   

## 2015-09-20 NOTE — Progress Notes (Signed)
When EMS moved pt to bed. picc line was pulled out. IV nurse looked at it and said entire tube was removed. IV team consult made to start new IV.
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
## 2015-09-20 NOTE — Progress Notes (Signed)
Patient ID: Juan Owens, male   DOB: 06-23-1931, 81 y.o.   MRN: [id number] [facility]  [facility].  Will need clearance and medical optimization for surgery.  Will plan on OR late tomorrow afternoon if medically cleared to treat his left hip fracture.

## 2015-09-20 NOTE — Progress Notes (Signed)
Report given to Windell MouldingRuth on 5N

## 2015-09-21 DIAGNOSIS — R9389 Abnormal findings on diagnostic imaging of other specified body structures: Secondary | ICD-10-CM | POA: Insufficient documentation

## 2015-09-21 DIAGNOSIS — S72142D Displaced intertrochanteric fracture of left femur, subsequent encounter for closed fracture with routine healing: Secondary | ICD-10-CM

## 2015-09-21 DIAGNOSIS — J189 Pneumonia, unspecified organism: Secondary | ICD-10-CM | POA: Insufficient documentation

## 2015-09-21 DIAGNOSIS — Z01818 Encounter for other preprocedural examination: Secondary | ICD-10-CM | POA: Insufficient documentation

## 2015-09-21 DIAGNOSIS — J9601 Acute respiratory failure with hypoxia: Secondary | ICD-10-CM

## 2015-09-21 DIAGNOSIS — R938 Abnormal findings on diagnostic imaging of other specified body structures: Secondary | ICD-10-CM

## 2015-09-21 LAB — GLUCOSE, CAPILLARY
GLUCOSE-CAPILLARY: 90 mg/dL (ref 65–99)
GLUCOSE-CAPILLARY: 96 mg/dL (ref 65–99)
GLUCOSE-CAPILLARY: 93 mg/dL (ref 65–99)
GLUCOSE-CAPILLARY: 96 mg/dL (ref 65–99)
Glucose-Capillary: 86 mg/dL (ref 65–99)
Glucose-Capillary: 79 mg/dL (ref 65–99)

## 2015-09-21 MED ORDER — SODIUM CHLORIDE 0.9 % IJ SOLN: 10.0000 mL | INTRAMUSCULAR | Status: DC | PRN

## 2015-09-21 MED ORDER — MORPHINE SULFATE (PF) 2 MG/ML IV SOLN: 1.0000 mg | INTRAVENOUS | Status: DC | PRN
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
## 2015-09-21 NOTE — Progress Notes (Signed)
Peripherally Inserted Central Catheter/Midline Placement  The IV Nurse has discussed with the patient and/or persons authorized to consent for the patient, the purpose of this procedure and the potential benefits and risks involved with this procedure.  The benefits include less needle sticks, lab draws from the catheter and patient may be discharged home with the catheter.  Risks include, but not limited to, infection, bleeding, blood clot (thrombus formation), and puncture of an artery; nerve damage and irregular heat beat.  Alternatives to this procedure were also discussed.  PICC/Midline Placement Documentation  PICC / Midline Single Lumen 09/21/15 PICC Left Basilic 42 cm 0 cm (Active)  Indication for Insertion or Continuance of Line Poor Vasculature-patient has had multiple peripheral attempts or PIVs lasting less than 24 hours 09/21/2015  1:00 PM  Exposed Catheter (cm) 0 cm 09/21/2015  1:00 PM  Dressing Change Due 09/28/15 09/21/2015  1:00 PM       Juan Owens, Juan Owens 09/21/2015, 1:49 PM

## 2015-09-21 NOTE — Clinical Social Work Note (Signed)
Clinical Social Work Assessment  Patient Details  Name: Juan Owens MRN: [id number] Date of Birth: 1930-12-26  Date of referral:  09/21/15               Reason for consult:  Facility Placement, Discharge Planning                Permission sought to share information with:  Facility Medical sales representativeContact Representative, Family Supports Permission granted to share information::  Yes, Verbal Permission Granted  Name::     Linus OrnMary Shaw  [place]  [facility] [place] [place] [facility]  Relationship::  Daughter  Contact Information:  [phone]  Housing/Transportation Living arrangements for the past 2 months:  Skilled Nursing Facility ([facility] [place]) Source of Information:  Adult Children Patient Interpreter Needed:  None Criminal Activity/Legal Involvement Pertinent to Current Situation/Hospitalization:  No - Comment as needed Significant Relationships:  Adult Children Lives with:  Facility Resident ([facility] [place]) Do you feel safe going back to the place where you live?  No (Daughter requesting new SNF search.) Need for family participation in patient care:  Yes (Comment) (Patient's daughter active in patient's care.)  Care giving concerns:  Patient's daughter requesting CSW attempt to find new SNF placement at time of discharge.   Social Worker assessment / plan:  CSW received referral stating patient admitted from facility ([facility] [place]). CSW spoke with patient's daughter regarding discharge disposition. Per patient's daughter, patient has been a [facility] at [facility] [place] for 2 years and 1 month. Patient's daughter informed CSW patient's family not happy with the care that the patient receives at [facility], but do like the close proximity to patient's family. Patient's daughter expressed preference for CSW to expand SNF search to [place] [facility] [place]. CSW to continue to follow and assist with discharge planning needs.  Employment status:  Retired Chief Financial Officernsurance  information:  Teacher, English as a foreign languageManaged Medicare ([employer] Medicare) PT Recommendations:  Not assessed at this time Information / Referral to community resources:  Skilled Nursing Facility  Patient/Family's Response to care:  Patient's daughter understanding and agreeable to CSW plan of care.  Patient/Family's Understanding of and Emotional Response to Diagnosis, Current Treatment, and Prognosis:  Patient's daughter understanding and agreeable to CSW plan of care.  Emotional Assessment Appearance:  Other (Comment Required (CSW spoke with patient's daughter via phone.) Attitude/Demeanor/Rapport:  Other (CSW spoke with patient's daughter via phone.) Affect (typically observed):  Other (CSW spoke with patient's daughter via phone.) Orientation:   (Not alert and oriented at this time.) Alcohol / Substance use:  Not Applicable Psych involvement (Current and /or in the community):  No (Comment) (Not appropriate on this admission.)  Discharge Needs  Concerns to be addressed:  No discharge needs identified Readmission within the last 30 days:  No Current discharge risk:  None Barriers to Discharge:  No Barriers Identified   Rod MaeVaughn, Kingsten Enfield S, LCSW 09/21/2015, 4:12 PM [phone]

## 2015-09-21 NOTE — Progress Notes (Signed)
This RN paged [facility] on call for clarification of the order for osmolite feeding. Patient is scheduled for surgery tomorrow 10/25 and has an order for NPO status. This RN was instructed by [facility]) not to start the feeding since the patient will be going to surgery. Will continue to monitor.
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
## 2015-09-21 NOTE — Procedures (Signed)
Vest CPT held due to hip fx.

## 2015-09-22 ENCOUNTER — Encounter (HOSPITAL_COMMUNITY): Payer: Self-pay | Admitting: Certified Registered"

## 2015-09-22 ENCOUNTER — Inpatient Hospital Stay (HOSPITAL_COMMUNITY): Payer: Medicare Other

## 2015-09-22 ENCOUNTER — Inpatient Hospital Stay (HOSPITAL_COMMUNITY): Payer: Medicare Other | Admitting: Anesthesiology

## 2015-09-22 ENCOUNTER — Encounter (HOSPITAL_COMMUNITY)
Admission: EM | Disposition: A | Discharge status: Skilled Nursing Facility | Payer: Self-pay | Source: Home / Self Care | Attending: Internal Medicine

## 2015-09-22 DIAGNOSIS — J189 Pneumonia, unspecified organism: Secondary | ICD-10-CM | POA: Insufficient documentation

## 2015-09-22 DIAGNOSIS — E876 Hypokalemia: Secondary | ICD-10-CM

## 2015-09-22 DIAGNOSIS — Y95 Nosocomial condition: Secondary | ICD-10-CM

## 2015-09-22 HISTORY — PX: INTRAMEDULLARY (IM) NAIL INTERTROCHANTERIC: SHX5875

## 2015-09-22 LAB — BASIC METABOLIC PANEL
ANION GAP: 8 (ref 5–15)
BUN: 12 mg/dL (ref 6–20)
CO2: 26 mmol/L (ref 22–32)
Calcium: 8.5 mg/dL — ABNORMAL LOW (ref 8.9–10.3)
Chloride: 112 mmol/L — ABNORMAL HIGH (ref 101–111)
Creatinine, Ser: 0.8 mg/dL (ref 0.61–1.24)
Glucose, Bld: 90 mg/dL (ref 65–99)
Potassium: 2.8 mmol/L — ABNORMAL LOW (ref 3.5–5.1)
SODIUM: 146 mmol/L — AB (ref 135–145)

## 2015-09-22 LAB — CBC
HEMATOCRIT: 24.6 % — AB (ref 39.0–52.0)
Hemoglobin: 7.9 g/dL — ABNORMAL LOW (ref 13.0–17.0)
MCH: 31.5 pg (ref 26.0–34.0)
MCHC: 32.1 g/dL (ref 30.0–36.0)
MCV: 98 fL (ref 78.0–100.0)
PLATELETS: 109 10*3/uL — AB (ref 150–400)
RBC: 2.51 MIL/uL — AB (ref 4.22–5.81)
RDW: 14.1 % (ref 11.5–15.5)
WBC: 9 10*3/uL (ref 4.0–10.5)

## 2015-09-22 LAB — MAGNESIUM: MAGNESIUM: 1.7 mg/dL (ref 1.7–2.4)

## 2015-09-22 LAB — GLUCOSE, CAPILLARY
GLUCOSE-CAPILLARY: 79 mg/dL (ref 65–99)
Glucose-Capillary: 79 mg/dL (ref 65–99)
Glucose-Capillary: 81 mg/dL (ref 65–99)
Glucose-Capillary: 90 mg/dL (ref 65–99)
Glucose-Capillary: 90 mg/dL (ref 65–99)

## 2015-09-22 LAB — PREPARE RBC (CROSSMATCH)

## 2015-09-22 LAB — ABO/RH: ABO/RH(D): A POS

## 2015-09-22 SURGERY — FIXATION, FRACTURE, INTERTROCHANTERIC, WITH INTRAMEDULLARY ROD
Anesthesia: General | Site: Hip | Laterality: Left

## 2015-09-22 MED ORDER — PHENOL 1.4 % MT LIQD: 1.0000 | OROMUCOSAL | Status: DC | PRN

## 2015-09-22 MED ORDER — LACTATED RINGERS IV SOLN
INTRAVENOUS | Status: DC
  Administered 2015-09-22: 17:00:00 via INTRAVENOUS

## 2015-09-22 MED ORDER — ONDANSETRON HCL 4 MG/2ML IJ SOLN: 4.0000 mg | Freq: Four times a day (QID) | INTRAMUSCULAR | Status: DC | PRN

## 2015-09-22 MED ORDER — CEFAZOLIN SODIUM-DEXTROSE 2-3 GM-% IV SOLR
2.0000 g | Freq: Four times a day (QID) | INTRAVENOUS | Status: AC
  Administered 2015-09-22 – 2015-09-23 (×3): 2 g via INTRAVENOUS
  Filled 2015-09-22 (×4): qty 50

## 2015-09-22 MED ORDER — POTASSIUM CHLORIDE 2 MEQ/ML IV SOLN
INTRAVENOUS | Status: AC
  Administered 2015-09-22: 08:00:00 via INTRAVENOUS
  Filled 2015-09-22 (×2): qty 1000

## 2015-09-22 MED ORDER — ROCURONIUM BROMIDE 100 MG/10ML IV SOLN
INTRAVENOUS | Status: DC | PRN
  Administered 2015-09-22: 30 mg via INTRAVENOUS

## 2015-09-22 MED ORDER — ACETAMINOPHEN 650 MG RE SUPP: 650.0000 mg | Freq: Four times a day (QID) | RECTAL | Status: DC | PRN

## 2015-09-22 MED ORDER — 0.9 % SODIUM CHLORIDE (POUR BTL) OPTIME
TOPICAL | Status: DC | PRN
  Administered 2015-09-22: 1000 mL

## 2015-09-22 MED ORDER — SODIUM CHLORIDE 0.9 % IV SOLN: Freq: Once | INTRAVENOUS | Status: DC

## 2015-09-22 MED ORDER — ALUM & MAG HYDROXIDE-SIMETH 200-200-20 MG/5ML PO SUSP: 30.0000 mL | ORAL | Status: DC | PRN

## 2015-09-22 MED ORDER — HYDROCODONE-ACETAMINOPHEN 5-325 MG PO TABS
1.0000 | ORAL_TABLET | Freq: Four times a day (QID) | ORAL | Status: DC | PRN
  Administered 2015-09-23: 1 via ORAL
  Filled 2015-09-22: qty 1

## 2015-09-22 MED ORDER — METHOCARBAMOL 1000 MG/10ML IJ SOLN
500.0000 mg | Freq: Four times a day (QID) | INTRAVENOUS | Status: DC | PRN
  Filled 2015-09-22: qty 5

## 2015-09-22 MED ORDER — PROPOFOL 10 MG/ML IV BOLUS
INTRAVENOUS | Status: DC | PRN
  Administered 2015-09-22: 90 mg via INTRAVENOUS

## 2015-09-22 MED ORDER — GLYCOPYRROLATE 0.2 MG/ML IJ SOLN
INTRAMUSCULAR | Status: DC | PRN
  Administered 2015-09-22: 0.3 mg via INTRAVENOUS

## 2015-09-22 MED ORDER — ACETAMINOPHEN 325 MG PO TABS
650.0000 mg | ORAL_TABLET | Freq: Four times a day (QID) | ORAL | Status: DC | PRN
  Administered 2015-09-23 – 2015-09-25 (×5): 650 mg via ORAL
  Filled 2015-09-22 (×5): qty 2

## 2015-09-22 MED ORDER — METHOCARBAMOL 500 MG PO TABS: 500.0000 mg | ORAL_TABLET | Freq: Four times a day (QID) | ORAL | Status: DC | PRN

## 2015-09-22 MED ORDER — ASPIRIN EC 325 MG PO TBEC
325.0000 mg | DELAYED_RELEASE_TABLET | Freq: Every day | ORAL | Status: DC
  Administered 2015-09-22 – 2015-09-23 (×2): 325 mg via ORAL
  Filled 2015-09-22 (×3): qty 1

## 2015-09-22 MED ORDER — METOCLOPRAMIDE HCL 5 MG PO TABS: 5.0000 mg | ORAL_TABLET | Freq: Three times a day (TID) | ORAL | Status: DC | PRN

## 2015-09-22 MED ORDER — OXYCODONE HCL 5 MG PO TABS: 5.0000 mg | ORAL_TABLET | ORAL | Status: DC | PRN

## 2015-09-22 MED ORDER — MORPHINE SULFATE (PF) 2 MG/ML IV SOLN: 0.5000 mg | INTRAVENOUS | Status: DC | PRN

## 2015-09-22 MED ORDER — POTASSIUM CHLORIDE 10 MEQ/100ML IV SOLN
10.0000 meq | INTRAVENOUS | Status: AC
  Administered 2015-09-22 (×4): 10 meq via INTRAVENOUS
  Filled 2015-09-22 (×4): qty 100

## 2015-09-22 MED ORDER — SODIUM CHLORIDE 0.9 % IV SOLN
INTRAVENOUS | Status: DC
  Administered 2015-09-24 – 2015-09-25 (×2): 125 mL/h via INTRAVENOUS
  Administered 2015-09-25 (×2): via INTRAVENOUS

## 2015-09-22 MED ORDER — FENTANYL CITRATE (PF) 250 MCG/5ML IJ SOLN
INTRAMUSCULAR | Status: AC
Start: 2015-09-22 — End: 2015-09-22
  Filled 2015-09-22: qty 5

## 2015-09-22 MED ORDER — NEOSTIGMINE METHYLSULFATE 10 MG/10ML IV SOLN
INTRAVENOUS | Status: DC | PRN
  Administered 2015-09-22: 2 mg via INTRAVENOUS

## 2015-09-22 MED ORDER — METOCLOPRAMIDE HCL 5 MG/ML IJ SOLN: 5.0000 mg | Freq: Three times a day (TID) | INTRAMUSCULAR | Status: DC | PRN

## 2015-09-22 MED ORDER — FENTANYL CITRATE (PF) 100 MCG/2ML IJ SOLN
INTRAMUSCULAR | Status: DC | PRN
  Administered 2015-09-22: 50 ug via INTRAVENOUS

## 2015-09-22 MED ORDER — ONDANSETRON HCL 4 MG/2ML IJ SOLN
INTRAMUSCULAR | Status: DC | PRN
  Administered 2015-09-22: 4 mg via INTRAVENOUS

## 2015-09-22 MED ORDER — HYDROCODONE-ACETAMINOPHEN 5-325 MG PO TABS: 1.0000 | ORAL_TABLET | Freq: Four times a day (QID) | ORAL | Status: DC | PRN

## 2015-09-22 MED ORDER — MENTHOL 3 MG MT LOZG: 1.0000 | LOZENGE | OROMUCOSAL | Status: DC | PRN

## 2015-09-22 MED ORDER — PROPOFOL 10 MG/ML IV BOLUS
INTRAVENOUS | Status: AC
  Filled 2015-09-22: qty 20

## 2015-09-22 MED ORDER — ASPIRIN EC 325 MG PO TBEC: 325.0000 mg | DELAYED_RELEASE_TABLET | Freq: Every day | ORAL | Status: DC

## 2015-09-22 MED ORDER — ONDANSETRON HCL 4 MG PO TABS: 4.0000 mg | ORAL_TABLET | Freq: Four times a day (QID) | ORAL | Status: DC | PRN

## 2015-09-22 SURGICAL SUPPLY — 43 items
BLADE SURG 15 STRL LF DISP TIS (BLADE) ×1 IMPLANT
BLADE SURG 15 STRL SS (BLADE) ×2
BNDG COHESIVE 4X5 TAN NS LF (GAUZE/BANDAGES/DRESSINGS) ×3 IMPLANT
BNDG COHESIVE 6X5 TAN STRL LF (GAUZE/BANDAGES/DRESSINGS) IMPLANT
BNDG GAUZE ELAST 4 BULKY (GAUZE/BANDAGES/DRESSINGS) ×3 IMPLANT
COVER PERINEAL POST (MISCELLANEOUS) ×3 IMPLANT
COVER SURGICAL LIGHT HANDLE (MISCELLANEOUS) ×3 IMPLANT
DRAPE PROXIMA HALF (DRAPES) IMPLANT
DRAPE STERI IOBAN 125X83 (DRAPES) ×3 IMPLANT
DRSG MEPILEX BORDER 4X4 (GAUZE/BANDAGES/DRESSINGS) ×6 IMPLANT
DRSG MEPILEX BORDER 4X8 (GAUZE/BANDAGES/DRESSINGS) IMPLANT
DRSG PAD ABDOMINAL 8X10 ST (GAUZE/BANDAGES/DRESSINGS) IMPLANT
DURAPREP 26ML APPLICATOR (WOUND CARE) ×3 IMPLANT
ELECT CAUTERY BLADE 6.4 (BLADE) ×3 IMPLANT
ELECT REM PT RETURN 9FT ADLT (ELECTROSURGICAL) ×3
ELECTRODE REM PT RTRN 9FT ADLT (ELECTROSURGICAL) ×1 IMPLANT
FACESHIELD WRAPAROUND (MASK) ×3 IMPLANT
GAUZE XEROFORM 5X9 LF (GAUZE/BANDAGES/DRESSINGS) IMPLANT
GLOVE NEODERM STRL 7.5 LF PF (GLOVE) ×2 IMPLANT
GLOVE SURG NEODERM 7.5  LF PF (GLOVE) ×4
GOWN STRL REIN XL XLG (GOWN DISPOSABLE) ×3 IMPLANT
GUIDE PIN 3.2X343 (PIN) ×1
GUIDE PIN 3.2X343MM (PIN) ×2
KIT BASIN OR (CUSTOM PROCEDURE TRAY) ×3 IMPLANT
KIT ROOM TURNOVER OR (KITS) ×3 IMPLANT
LINER BOOT UNIVERSAL DISP (MISCELLANEOUS) IMPLANT
MANIFOLD NEPTUNE II (INSTRUMENTS) IMPLANT
NAIL TRIGEN 10MMX40CM-125 LEFT (Nail) ×3 IMPLANT
NS IRRIG 1000ML POUR BTL (IV SOLUTION) ×3 IMPLANT
PACK GENERAL/GYN (CUSTOM PROCEDURE TRAY) ×3 IMPLANT
PAD ARMBOARD 7.5X6 YLW CONV (MISCELLANEOUS) ×6 IMPLANT
PAD CAST 4YDX4 CTTN HI CHSV (CAST SUPPLIES) IMPLANT
PADDING CAST COTTON 4X4 STRL (CAST SUPPLIES)
PIN GUIDE 3.2X343MM (PIN) ×1 IMPLANT
SCREW LAG COMPR KIT 95/90 (Screw) ×3 IMPLANT
STAPLER VISISTAT 35W (STAPLE) ×3 IMPLANT
SUT VIC AB 0 CT1 27 (SUTURE) ×2
SUT VIC AB 0 CT1 27XBRD ANBCTR (SUTURE) ×1 IMPLANT
SUT VIC AB 2-0 CT1 27 (SUTURE) ×2
SUT VIC AB 2-0 CT1 TAPERPNT 27 (SUTURE) ×1 IMPLANT
TOWEL OR 17X24 6PK STRL BLUE (TOWEL DISPOSABLE) IMPLANT
TOWEL OR 17X26 10 PK STRL BLUE (TOWEL DISPOSABLE) ×3 IMPLANT
WATER STERILE IRR 1000ML POUR (IV SOLUTION) IMPLANT

## 2015-09-22 NOTE — Anesthesia Postprocedure Evaluation (Signed)
  Anesthesia Post-op Note  Patient: Juan Owens  Procedure(s) Performed: Procedure(s): INTRAMEDULLARY (IM) NAIL INTERTROCHANTRIC/ HIP SCREW (Left)  Patient Location: PACU  Anesthesia Type: General   Level of Consciousness: awake, alert  and oriented  Airway and Oxygen Therapy: Patient Spontanous Breathing  Post-op Pain: mild  Post-op Assessment: Post-op Vital signs reviewed  Post-op Vital Signs: Reviewed  Last Vitals:  Filed Vitals:   09/22/15 1915  BP: 134/65  Pulse: 80  Temp:   Resp: 19    Complications: No apparent anesthesia complications

## 2015-09-22 NOTE — Transfer of Care (Signed)
Immediate Anesthesia Transfer of Care Note  Patient: Juan Owens  Procedure(s) Performed: Procedure(s): INTRAMEDULLARY (IM) NAIL INTERTROCHANTRIC/ HIP SCREW (Left)  Patient Location: PACU  Anesthesia Type:General  Level of Consciousness: sedated and responds to stimulation  Airway & Oxygen Therapy: Patient connected to nasal cannula oxygen  Post-op Assessment: Report given to RN and Post -op Vital signs reviewed and stable  Post vital signs: Reviewed and stable  Last Vitals:  Filed Vitals:   09/22/15 1858  BP: 136/73  Pulse: 80  Temp: 36.6 C  Resp: 16    Complications: No apparent anesthesia complications

## 2015-09-22 NOTE — Progress Notes (Signed)
   Subjective:  Patient resting in bed on 6L oxygen.  Objective:   VITALS:   Filed Vitals:   09/21/15 1652 09/21/15 2013 09/22/15 0008 09/22/15 0444  BP: 94/47 86/49 103/59 115/59  Pulse: 82 78 76 74  Temp: 98.5 F (36.9 C) 99.7 F (37.6 C) 98.7 F (37.1 C) 98.8 F (37.1 C)  TempSrc: Oral Axillary Axillary Axillary  Resp: 18 16 16 16   Height:      Weight:    73.3 kg (161 lb 9.6 oz)  SpO2: 97% 96%  99%    Stable exam 6L [place] 95% sats   Lab Results  Component Value Date   WBC 9.0 09/22/2015   HGB 7.9* 09/22/2015   HCT 24.6* 09/22/2015   MCV 98.0 09/22/2015   PLT 109* 09/22/2015     Assessment/Plan:  Left IT hip fx  - Hgb trending down 2/2 to bleeding from fracture - patient respiratory status is poor but stable for now - afebrile - patient is at high risk for periop complications per primary and critical care - however surgery gives patient the best chance of being ambulatory - we will await surgical clearance from hospitalist and CCM if surgery can proceed today - if patient is optimized for surgery today, then Dr. Magnus IvanBlackman has agreed to perform surgery in the mid afternoon - if patient needs more medical optimization today, then I will perform surgery tomorrow  Cheral AlmasXu, Dorothia Passmore Michael 09/22/2015, 7:51 AM [phone]

## 2015-09-22 NOTE — Procedures (Signed)
Pt has diminished breath sounds and ineffective cough.  Pt unable to move secretions.   CPT held for the evening.

## 2015-09-22 NOTE — Anesthesia Procedure Notes (Signed)
Procedure Name: Intubation Date/Time: 09/22/2015 5:55 PM Performed by: De NurseENNIE, Kesleigh Morson E Pre-anesthesia Checklist: Patient identified, Emergency Drugs available, Suction available, Patient being monitored and Timeout performed Patient Re-evaluated:Patient Re-evaluated prior to inductionOxygen Delivery Method: Circle system utilized Preoxygenation: Pre-oxygenation with 100% oxygen Intubation Type: IV induction Ventilation: Mask ventilation without difficulty Laryngoscope Size: Mac and 4 Grade View: Grade I Tube type: Subglottic suction tube Tube size: 7.5 mm Number of attempts: 1 Airway Equipment and Method: Stylet Placement Confirmation: ETT inserted through vocal cords under direct vision,  positive ETCO2 and breath sounds checked- equal and bilateral Secured at: 22 cm Tube secured with: Tape Dental Injury: Teeth and Oropharynx as per pre-operative assessment

## 2015-09-22 NOTE — Progress Notes (Signed)
PROGRESS NOTE    Juan Owens OZH:[id number] DOB: Nov 10, 1931 DOA: 09/18/2015 PCP: Pearson Grippe, MD  HPI/Brief narrative 81 year old male patient with history of advanced dementia, DVT & PE status post IVC filter, stroke, HTN, dysphagia status post PEG tube, aspiration pneumonia, chronic subdural hematoma, former smoker, presented to [facility] on 09/19/15 after a fall at SNF and resultant left hip fracture. Due to multiple significant comorbidities, he was felt to to be high risk and hence was transferred to [facility] for further evaluation and management. Did not use oxygen prior to admission. Noted to be hypoxic with oxygen saturations in the 88-90 percent on 6 L/m oxygen via nasal cannula. CCM consulted and surgery was postponed by a day for stabilization. Plan for surgical fixation on 10/25 with family accepting high risk.   Assessment/Plan:  Acute hypoxic respiratory failure - Secondary to aspiration versus healthcare associated pneumonia versus atelectasis. - Continue broad-spectrum IV antibiotics i.e. vancomycin and Zosyn, supplemental oxygen, aggressive pulmonary toilet as able-patient most likely will not cooperate for incentive spirometry or chest vest - PCCM follow up appreciated: Were the dealing surgery anymore would be of benefit and have okayed to proceed for surgery 10/25. Discussed with orthopedics. Patient remains high risk for perioperative complications. - Improving. Saturating in the high 90s on 2 L oxygen this morning.  Aspiration pneumonia versus HCAP - Management as above - Blood cultures 2: Negative to date. Urine culture shows gram-positive cocci-Zosyn should cover. Await final culture results.  Sepsis - Likely secondary to pneumonia. Present on admission but since then has resolved.  Left hip fracture - Overall patient is at high risk for perioperative complications from his current tenuous respiratory status. Patient ambulated prior to his fall and hip  fracture and hence hip fracture surgery will be palliative for pain control and mobilization. Need to stabilize his respiratory status and consider surgery after that. Discussed with orthopedics regarding consideration for spinal over general anesthesia. If he is intubated, he is at risk for prolonged respiratory failure/prolonged ventilation. - Discussed with Dr. Roda Shutters who recommended transfusing 1 unit of PRBC prior to surgery-to be done later this afternoon.   Chronic subdural hematoma - Prior hospitalist colleagues have discussed with neurosurgery and no intervention recommended.  Chronic dysphagia - On PEG tube feeding-currently on hold for surgery. To be resumed postop when appropriate .  Anemia - hemoglobin has progressively dropped from 12.8 on admission to 7.9 on 10/25. No overt source noted. Possibly from left hip fracture. As discussed with orthopedics, transfuse 1 unit PRBC prior to surgery. Follow CBC and transfuse if hemoglobin less than 8 g per DL.   Thrombocytopenia - Unclear etiology. Platelet count has also gradually dropped from 156-109. Follow CBCs  Advanced dementia with behavioral disturbances and depression - Mental status possibly at baseline.  Right upper lobe pulmonary nodule, seen on CT - No further follow-up since patient will not be a candidate for any aggressive treatment  Hypokalemia - Replaced by IV once and in IV fluids. Follow BMP in a.m. Magnesium 1.7.   DVT prophylaxis: SCDs Code Status: DO NOT RESUSCITATE Family Communication: Discussed with daughter on 09/21/15 Disposition Plan: DC back to SNF when medically stable   Consultants:  Orthopedics  PCCM  Procedures:  Right upper extremity PICC, 09/19/15  2-D echo 10/22: Study Conclusions - Left ventricle: The cavity size was normal. Wall thickness was normal. Systolic function was normal. The estimated ejection fraction was in the range of 55% to 60%. Wall motion was normal; there  were  no regional wall motion abnormalities. Doppler parameters are consistent with abnormal left ventricular relaxation (grade 1 diastolic dysfunction). - Right ventricle: The cavity size was normal. Wall thickness was increased. - Right atrium: The atrium was mildly dilated. - Pulmonary arteries: Systolic pressure was moderately increased. PA peak pressure: 55 mm Hg (S).  Foley catheter  Antibiotics:  Vancomycin 10/22>>  Zosyn 10/22 >>  Subjective: Patient mostly nonverbal this morning. As per nursing, no acute issues.   Objective: Filed Vitals:   09/21/15 1652 09/21/15 2013 09/22/15 0008 09/22/15 0444  BP: 94/47 86/49 103/59 115/59  Pulse: 82 78 76 74  Temp: 98.5 F (36.9 C) 99.7 F (37.6 C) 98.7 F (37.1 C) 98.8 F (37.1 C)  TempSrc: Oral Axillary Axillary Axillary  Resp: Height:      Weight:    73.3 kg (161 lb 9.6 oz)  SpO2: 97% 96%  99%    Intake/Output Summary (Last 24 hours) at 09/22/15 0743 Last data filed at 09/22/15 0530  Gross per 24 hour  Intake     10 ml  Output    675 ml  Net   -665 ml   Filed Weights   09/20/15 2139 09/21/15 0434 09/22/15 0444  Weight: 69.6 kg (153 lb 7 oz) 70.7 kg (155 lb 13.8 oz) 73.3 kg (161 lb 9.6 oz)     Exam:  General exam: Elderly frail male lying comfortably propped up in bed Respiratory system: Reduced breath sounds in the bases with few basal crackles. Rest of lung fields clear to auscultation. No increased work of breathing. Cardiovascular system: S1 & S2 heard, RRR. No JVD, murmurs, gallops, clicks or pedal edema.  Gastrointestinal system: Abdomen is nondistended, soft and nontender. Normal bowel sounds heard. Central nervous system: Alert but mostly non verbal and does not follow instructions. No focal neurological deficits. Extremities: Moves all limbs symmetrically except left lower extremity with movements limited by pain.   Data Reviewed: Basic Metabolic Panel:  Recent Labs Lab  09/18/15 2325 09/19/15 0429 09/20/15 0428 09/20/15 1015 09/22/15 0530  NA 140 142 142 145 146*  K 3.9 4.3 3.6 3.6 2.8*  CL 108 111 113* 115* 112*  CO2 GLUCOSE 215* 113* 109* 107* 90  BUN 22* 22* CREATININE 0.89 0.77 0.70 0.64 0.80  CALCIUM 9.4 9.1 8.4* 8.3* 8.5*   Liver Function Tests:  Recent Labs Lab 09/18/15 2325 09/20/15 0428  AST 30 25  ALT 22 14*  ALKPHOS 113 73  BILITOT 0.9 1.1  PROT 7.2 5.6*  ALBUMIN 4.0 2.9*   No results for input(s): LIPASE, AMYLASE in the last 168 hours. No results for input(s): AMMONIA in the last 168 hours. CBC:  Recent Labs Lab 09/18/15 2325 09/19/15 0429 09/20/15 0428 09/22/15 0530  WBC 13.3* 9.0 7.3 9.0  NEUTROABS 11.8*  --   --   --   HGB 12.8* 11.3* 9.5* 7.9*  HCT 39.3 35.4* 29.4* 24.6*  MCV 98.5 99.2 99.7 98.0  PLT 156 141* 112* 109*   Cardiac Enzymes: No results for input(s): CKTOTAL, CKMB, CKMBINDEX, TROPONINI in the last 168 hours. BNP (last 3 results) No results for input(s): PROBNP in the last 8760 hours. CBG:  Recent Labs Lab 09/21/15 1136 09/21/15 1649 09/21/15 2011 09/22/15 0005 09/22/15 0430  GLUCAP 93 79 96 90 79    Recent Results (from the past 240 hour(s))  Blood culture (routine x 2)  Status: None (Preliminary result)   Collection Time: 09/19/15 12:09 AM  Result Value Ref Range Status   Specimen Description BLOOD LEFT ARM  Final   Special Requests BOTTLES DRAWN AEROBIC AND ANAEROBIC 8CC  Final   Culture NO GROWTH 2 DAYS  Final   Report Status PENDING  Incomplete  Blood culture (routine x 2)     Status: None (Preliminary result)   Collection Time: 09/19/15 12:13 AM  Result Value Ref Range Status   Specimen Description BLOOD LEFT HAND  Final   Special Requests BOTTLES DRAWN AEROBIC AND ANAEROBIC 8CC  Final   Culture NO GROWTH 2 DAYS  Final   Report Status PENDING  Incomplete  Urine culture     Status: None (Preliminary result)   Collection Time: 09/19/15 12:58 AM   Result Value Ref Range Status   Specimen Description URINE, CATHETERIZED  Final   Special Requests NONE  Final   Culture   Final    >=100,000 COLONIES/mL GRAM POSITIVE COCCI Per[facility]    Report Status PENDING  Incomplete  MRSA PCR Screening     Status: Abnormal   Collection Time: 09/19/15  1:51 AM  Result Value Ref Range Status   MRSA by PCR POSITIVE (A) NEGATIVE Final    Comment:        The GeneXpert MRSA Assay (FDA approved for NASAL specimens only), is one component of a comprehensive MRSA colonization surveillance program. It is not intended to diagnose MRSA infection nor to guide or monitor treatment for MRSA infections. RESULT CALLED TO, READ BACK BY AND VERIFIED WITH:  HAMMOCK,S @ 0455 ON 09/19/15 BY WOODIE,J            Studies: Ct Chest W Contrast  09/20/2015  CLINICAL DATA:  Abnormal chest x-ray with increased bibasilar atelectasis versus infiltrate, LEFT hip fracture, stroke, hypertension EXAM: CT CHEST WITH CONTRAST TECHNIQUE: Multidetector CT imaging of the chest was performed during intravenous contrast administration. Sagittal and coronal MPR images reconstructed from axial data set. CONTRAST:  100mL OMNIPAQUE IOHEXOL 300 MG/ML  SOLN IV COMPARISON:  None; correlation chest radiograph 09/20/2015 FINDINGS: Scattered atherosclerotic calcifications aorta and proximal great vessels. Thoracic vascular structures grossly patent on nondedicated exam. Multiple normal sized to minimally enlarged LEFT paratracheal lymph nodes. Visualized upper abdomen unremarkable. Bones demineralized. COPD changes with probable consolidation in both lower lobes. Respiratory motion artifacts degrade exam. Small amount of infiltrate posteriorly in the RIGHT upper lobe. Nonspecific 6 mm RIGHT upper lobe nodule image 37. Remaining lungs clear. No significant pleural effusion or pneumothorax. IMPRESSION: BILATERAL lower lobe consolidation with underlying COPD. Minimally  prominent mediastinal lymph nodes, potentially reactive in the setting of acute infiltrate. Nonspecific 6 mm RIGHT upper lobe nodule, recommendation below. If the patient is at high risk for bronchogenic carcinoma, follow-up chest CT at 6-12 months is recommended. If the patient is at low risk for bronchogenic carcinoma, follow-up chest CT at 12 months is recommended. This recommendation follows the consensus statement: Guidelines for Management of Small Pulmonary Nodules Detected on CT Scans: A Statement from the Fleischner Society as published in Radiology 2005;237:395-400. Electronically Signed   By: Ulyses SouthwardMark  Boles M.D.   On: 09/20/2015 15:47        Scheduled Meds: . antiseptic oral rinse  7 mL Mouth Rinse q12n4p  . chlorhexidine  15 mL Mouth Rinse BID  . Chlorhexidine Gluconate Cloth  6 each Topical Q0600  . feeding supplement (OSMOLITE 1.5 CAL)  474 mL Per Tube QID  .  free water  100 mL Per Tube 3 times per day  . mupirocin ointment  1 application Nasal BID  . pantoprazole sodium  40 mg Oral BID AC  . piperacillin-tazobactam (ZOSYN)  IV  3.375 g Intravenous Q8H  . potassium chloride  10 mEq Intravenous Q1 Hr x 4  . sertraline  25 mg Oral Daily  . sodium chloride  10-40 mL Intracatheter Q12H  . valproic acid  125 mg Oral BID  . vancomycin  750 mg Intravenous Q12H   Continuous Infusions: . sodium chloride 65 mL/hr at 09/21/15 0436  . dextrose 5 % 1,000 mL with potassium chloride 40 mEq infusion      Principal Problem:   Sepsis due to undetermined organism (HCC) Active Problems:   S/P percutaneous endoscopic gastrostomy (PEG) tube placement [facility])   Intertrochanteric fracture of left hip (HCC)   Dementia with behavioral disturbance   Acute respiratory failure with hypoxia (HCC)   Thrombocytopenia (HCC)   Subdural hematoma (HCC)   Pulmonary hypertension (HCC)   Normocytic anemia   Abnormal CXR   Pneumonia   Pre-op evaluation    Time spent: 25 minutes    Kaion Tisdale,  MD, FACP, FHM. [facility] Pager [phone]  If 7PM-7AM, please contact night-coverage [email or web address] Password [email or web address] 09/22/2015, 7:43 AM    LOS: 3 days

## 2015-09-22 NOTE — Op Note (Signed)
   Date of Surgery: 09/22/2015  INDICATIONS: Mr. Juan Owens is a 81 y.o.-year-old male who sustained a left hip fracture. The risks and benefits of the procedure discussed with the family prior to the procedure and all questions were answered; consent was obtained.  PREOPERATIVE DIAGNOSIS: left hip fracture   POSTOPERATIVE DIAGNOSIS: Same   PROCEDURE: Treatment of intertrochanteric, pertrochanteric, subtrochanteric fracture with intramedullary implant. CPT [phone]   SURGEON: N. Glee ArvinMichael Launi Asencio, M.D.   ANESTHESIA: general   IV FLUIDS AND URINE: See anesthesia record   ESTIMATED BLOOD LOSS: 100 cc  IMPLANTS: [employer] InterTAN 10 x 40 cm, 95/90  DRAINS: None.   COMPLICATIONS: None.   DESCRIPTION OF PROCEDURE: The patient was brought to the operating room and placed supine on the operating table. The patient's leg had been signed prior to the procedure. The patient had the anesthesia placed by the anesthesiologist. The prep verification and incision time-outs were performed to confirm that this was the correct patient, site, side and location. The patient had an SCD on the opposite lower extremity. The patient did receive antibiotics prior to the incision and was re-dosed during the procedure as needed at indicated intervals. The patient was positioned on the fracture table with the table in traction and internal rotation to reduce the hip. The well leg was placed in a scissor position and all bony prominences were well-padded. The patient had the lower extremity prepped and draped in the standard surgical fashion. The incision was made 4 finger breadths superior to the greater trochanter. A guide pin was inserted into the tip of the greater trochanter under fluoroscopic guidance. An opening reamer was used to gain access to the femoral canal. The nail length was measured and inserted down the femoral canal to its proper depth. The appropriate version of insertion for the lag screw was found under  fluoroscopy. A pin was inserted up the femoral neck through the jig. Then, a second antirotation pin was inserted inferior to the first pin. The length of the lag screw was then measured. The lag screw was inserted as near to center-center in the head as possible. The antirotation pin was then taken out and an interdigitating compression screw was placed in its place. The leg was taken out of traction, then the interdigitating compression screw was used to compress across the fracture. Compression was visualized on serial xrays. The wound was copiously irrigated with saline and the subcutaneous layer closed with 2.0 vicryl and the skin was reapproximated with staples. The wounds were cleaned and dried a final time and a sterile dressing was placed. The hip was taken through a range of motion at the end of the case under fluoroscopic imaging to visualize the approach-withdraw phenomenon and confirm implant length in the head. The patient was then awakened from anesthesia and taken to the recovery room in stable condition. All counts were correct at the end of the case.   POSTOPERATIVE PLAN: The patient will be weight bearing as tolerated and will return in 2 weeks for staple removal and the patient will receive DVT prophylaxis based on other medications, activity level, and risk ratio of bleeding to thrombosis.   Mayra ReelN. Michael Jacklin Zwick, MD [facility] [phone] 6:30 PM

## 2015-09-22 NOTE — Progress Notes (Signed)
ANTIBIOTIC CONSULT NOTE - FOLLOW UP  Pharmacy Consult for Vancomycin, Zosyn Indication: pneumonia  Allergies  Allergen Reactions  . No Known Allergies    Labs:  Recent Labs  09/20/15 0428 09/20/15 1015 09/22/15 0530  WBC 7.3  --  9.0  HGB 9.5*  --  7.9*  PLT 112*  --  109*  CREATININE 0.70 0.64 0.80   Estimated Creatinine Clearance: 73.8 mL/min (by C-G formula based on Cr of 0.8). No results for input(s): VANCOTROUGH, VANCOPEAK, VANCORANDOM, GENTTROUGH, GENTPEAK, GENTRANDOM, TOBRATROUGH, TOBRAPEAK, TOBRARND, AMIKACINPEAK, AMIKACINTROU, AMIKACIN in the last 72 hours.    Assessment: 81 year old male transferred from [facility] on Vancomycin and Zosyn (since 10/22) for pneumonia, also with hip fracture s/p fall (for repair today) Scr stable, WBC stable Blood cultures negative to date  Goal of Therapy:  Vancomycin trough level 15-20 mcg/ml  Appropriate Zosyn dosing  Plan:  Continue Vancomycin 750 mg iv Q 12 hours - consider trough later this week if continues Continue Zosyn 3.375 grams iv Q 8 hours Continue to follow  Thank you Okey RegalLisa Janiqua Friscia, PharmD [phone]  09/22/2015,11:46 AM

## 2015-09-22 NOTE — Progress Notes (Signed)
Name: Juan PeaceHenry Owens MRN: [id number] DOB: February 16, 1931    ADMISSION DATE:  09/18/2015 CONSULTATION DATE:  10/24  REFERRING MD :  [email or web address]   CHIEF COMPLAINT:  Pre-op clearance   BRIEF PATIENT DESCRIPTION: 81yo male former smoker with hx stroke, HTN, chronic stable SDH, dysphagia with chronic PEG, DVT s/p IVC filter, severe dementia presented 10/22 after a fall at SNF with resultant L hip fx.  Was awaiting hip repair but developed some dyspnea, hypoxia and PCCM consulted 10/24 for assistance pre-op.   Per daughter pt is nonverbal at baseline but is ambulatory.  He tripped and fell, no LOC, no obvious pre-syncope.  Chronic PEG.    SIGNIFICANT EVENTS  10/14 surgery deferred due to hypoxia   STUDIES:  Pelvis xray 10/22>> Comminuted, intertrochanteric left hip fracture CT head 10/22>> Vague 6 mm subdural hematoma overlying the left parietal lobe may be subacute in nature. No additional evidence for traumatic injury to the head. 2D echo 10/22>> grade 1 diastolic dysfunction, mod pulm HTN CT chest 10/23>>> Bilat lower lobe consolidation, COPD, 6mm RUL nodule   SUBJECTIVE:  Nonverbal No obvious pain On 5-6 L oxygen nasal cannula, work of breathing okay  VITAL SIGNS: Temp:  [98.5 F (36.9 C)-99.7 F (37.6 C)] 98.8 F (37.1 C) (10/25 0444) Pulse Rate:  [74-82] 74 (10/25 0444) Resp:  [16-18] 16 (10/25 0444) BP: (86-115)/(47-59) 115/59 mmHg (10/25 0444) SpO2:  [96 %-99 %] 99 % (10/25 0444) Weight:  [73.3 kg (161 lb 9.6 oz)] 73.3 kg (161 lb 9.6 oz) (10/25 0444)  PHYSICAL EXAMINATION: General:  Frail, chronically ill appearing male, NAD  Neuro:  Opens eyes, tracks at times, does not follow any commands, non verbal  HEENT:  Mm moist, no JVD  Cardiovascular:  s1s2rrr Lungs:  resps even, mildly tachypneic on 6L [place], diminished bases, few scattered rhonchi  Abdomen:  Round, soft, PEG Musculoskeletal:  Warm bilat, dry, no edema     Recent Labs Lab 09/20/15 0428 09/20/15 1015  09/22/15 0530  NA 142 145 146*  K 3.6 3.6 2.8*  CL 113* 115* 112*  CO2 24 24 26   BUN 13 11 12   CREATININE 0.70 0.64 0.80  GLUCOSE 109* 107* 90    Recent Labs Lab 09/19/15 0429 09/20/15 0428 09/22/15 0530  HGB 11.3* 9.5* 7.9*  HCT 35.4* 29.4* 24.6*  WBC 9.0 7.3 9.0  PLT 141* 112* 109*   Ct Chest W Contrast  09/20/2015  CLINICAL DATA:  Abnormal chest x-ray with increased bibasilar atelectasis versus infiltrate, LEFT hip fracture, stroke, hypertension EXAM: CT CHEST WITH CONTRAST TECHNIQUE: Multidetector CT imaging of the chest was performed during intravenous contrast administration. Sagittal and coronal MPR images reconstructed from axial data set. CONTRAST:  100mL OMNIPAQUE IOHEXOL 300 MG/ML  SOLN IV COMPARISON:  None; correlation chest radiograph 09/20/2015 FINDINGS: Scattered atherosclerotic calcifications aorta and proximal great vessels. Thoracic vascular structures grossly patent on nondedicated exam. Multiple normal sized to minimally enlarged LEFT paratracheal lymph nodes. Visualized upper abdomen unremarkable. Bones demineralized. COPD changes with probable consolidation in both lower lobes. Respiratory motion artifacts degrade exam. Small amount of infiltrate posteriorly in the RIGHT upper lobe. Nonspecific 6 mm RIGHT upper lobe nodule image 37. Remaining lungs clear. No significant pleural effusion or pneumothorax. IMPRESSION: BILATERAL lower lobe consolidation with underlying COPD. Minimally prominent mediastinal lymph nodes, potentially reactive in the setting of acute infiltrate. Nonspecific 6 mm RIGHT upper lobe nodule, recommendation below. If the patient is at high risk for bronchogenic carcinoma, follow-up chest CT at 6-12  months is recommended. If the patient is at low risk for bronchogenic carcinoma, follow-up chest CT at 12 months is recommended. This recommendation follows the consensus statement: Guidelines for Management of Small Pulmonary Nodules Detected on CT Scans: A  Statement from the Fleischner Society as published in Radiology 2005;237:395-400. Electronically Signed   By: Ulyses Southward M.D.   On: 09/20/2015 15:47    ASSESSMENT / PLAN:  Acute hypoxic respiratory failure - HCAP v atx.  Currently requiring 6L [place] with O2 sats 88-90%, mildly tachypneic.  HCAP vs aspiration-bibasal consolidation versus atelectasis, better seen on CT Chronic SDH  Anemia -- 2gm hgb drop in 24 hours -  No obvious s/s  external bleeding, presumed bleeding and a fracture Severe dementia  Dysphagia - chronic PEG  Left Hip Fx   REC -  Supplemental O2 as needed to keep sats >90% Continue broad spectrum abx for HCAP  Aggressive pulm hygiene as able -- attempt IS, flutter but highly doubt he can cooperate with this  Attempt chest vest - again doubt he will be cooperative with this   Note DNR  Overall pt is at high risk for peri-operative complications, more so with current respiratory status. He is at significant risk for prolonged resp failure given his overall status and the PNA.   Daughter seems realistic about some aspects of his care, but fairly unrealistic about his surgical risk.  Not sure that delaying surgery anymore would be of benefit. Okay to proceed today. He can be extubated postoperatively.  [facility] M available as needed  Oretha Milch MD  Cyril Mourning MD. Tonny Bollman. [facility] Pager [phone] If no response call [phone]    09/22/2015  8:19 AM

## 2015-09-22 NOTE — Care Management Note (Signed)
Case Management Note  Patient Details  Name: Juan Owens MRN: [id number] Date of Birth: July 07, 1931  Subjective/Objective:           Admitted with hip fracture and SIRS         Action/Plan: Admitted from [facility], referral made to CSW. Will continue to follow for discharge needs.  Expected Discharge Date:  09/22/15               Expected Discharge Plan:  Skilled Nursing Facility  In-House Referral:  Clinical Social Work  Discharge planning Services  CM Consult  Post Acute Care Choice:    Choice offered to:     DME Arranged:    DME Agency:     HH Arranged:    HH Agency:     Status of Service:  In process, will continue to follow  Medicare Important Message Given:    Date Medicare IM Given:    Medicare IM give by:    Date Additional Medicare IM Given:    Additional Medicare Important Message give by:     If discussed at Long Length of Stay Meetings, dates discussed:    Additional Comments:  Juan Owens, Juan Owens Watson, RN 09/22/2015, 3:01 PM

## 2015-09-22 NOTE — Discharge Instructions (Signed)
    1. Change dressings as needed 2. May shower but keep incisions covered and dry 3. Take aspirin to prevent blood clots 4. Take stool softeners as needed 5. Take pain meds as needed  

## 2015-09-22 NOTE — Anesthesia Postprocedure Evaluation (Signed)
  Anesthesia Post-op Note  Patient: Juan Owens  Procedure(s) Performed: Procedure(s): INTRAMEDULLARY (IM) NAIL INTERTROCHANTRIC/ HIP SCREW (Left)  Patient Location: PACU  Anesthesia Type: General   Level of Consciousness: awake, alert  and oriented  Airway and Oxygen Therapy: Patient Spontanous Breathing  Post-op Pain: mild  Post-op Assessment: Post-op Vital signs reviewed  Post-op Vital Signs: Reviewed  Last Vitals:  Filed Vitals:   09/22/15 2051  BP: 101/56  Pulse: 80  Temp: 36.5 C  Resp: 16    Complications: No apparent anesthesia complications

## 2015-09-22 NOTE — Anesthesia Preprocedure Evaluation (Addendum)
Anesthesia Evaluation  Patient identified by MRN, date of birth, ID band Patient awake    Reviewed: Allergy & Precautions, NPO status , Patient's Chart, lab work & pertinent test results  Airway Mallampati: II   Neck ROM: full    Dental  (+) Edentulous Upper, Partial Lower   Pulmonary shortness of breath, pneumonia, former smoker,     + decreased breath sounds      Cardiovascular hypertension,  Rhythm:regular Rate:Normal     Neuro/Psych Severe dementia.  Pt has a SDH. CVA    GI/Hepatic PEG in place.   Endo/Other    Renal/GU      Musculoskeletal   Abdominal   Peds  Hematology   Anesthesia Other Findings   Reproductive/Obstetrics                           Anesthesia Physical Anesthesia Plan  ASA: IV  Anesthesia Plan: General   Post-op Pain Management:    Induction: Intravenous  Airway Management Planned: Oral ETT  Additional Equipment:   Intra-op Plan:   Post-operative Plan: Possible Post-op intubation/ventilation  Informed Consent: I have reviewed the patients History and Physical, chart, labs and discussed the procedure including the risks, benefits and alternatives for the proposed anesthesia with the patient or authorized representative who has indicated his/her understanding and acceptance.     Plan Discussed with: CRNA, Anesthesiologist and Surgeon  Anesthesia Plan Comments:         Anesthesia Quick Evaluation

## 2015-09-23 ENCOUNTER — Encounter (HOSPITAL_COMMUNITY): Payer: Self-pay | Admitting: Orthopaedic Surgery

## 2015-09-23 DIAGNOSIS — A419 Sepsis, unspecified organism: Secondary | ICD-10-CM

## 2015-09-23 DIAGNOSIS — J189 Pneumonia, unspecified organism: Secondary | ICD-10-CM | POA: Insufficient documentation

## 2015-09-23 LAB — URINE CULTURE

## 2015-09-23 LAB — CBC
HCT: 28.9 % — ABNORMAL LOW (ref 39.0–52.0)
HEMOGLOBIN: 9.2 g/dL — AB (ref 13.0–17.0)
MCH: 30.7 pg (ref 26.0–34.0)
MCHC: 31.8 g/dL (ref 30.0–36.0)
MCV: 96.3 fL (ref 78.0–100.0)
Platelets: 122 10*3/uL — ABNORMAL LOW (ref 150–400)
RBC: 3 MIL/uL — ABNORMAL LOW (ref 4.22–5.81)
RDW: 15.9 % — ABNORMAL HIGH (ref 11.5–15.5)
WBC: 10.2 10*3/uL (ref 4.0–10.5)

## 2015-09-23 LAB — BASIC METABOLIC PANEL
Anion gap: 10 (ref 5–15)
BUN: 13 mg/dL (ref 6–20)
CHLORIDE: 106 mmol/L (ref 101–111)
CO2: 25 mmol/L (ref 22–32)
CREATININE: 0.68 mg/dL (ref 0.61–1.24)
Calcium: 8.5 mg/dL — ABNORMAL LOW (ref 8.9–10.3)
GFR calc Af Amer: >60 mL/min (ref >60)
GFR calc non Af Amer: >60 mL/min (ref >60)
GLUCOSE: 151 mg/dL — AB (ref 65–99)
Potassium: 3.1 mmol/L — ABNORMAL LOW (ref 3.5–5.1)
SODIUM: 141 mmol/L (ref 135–145)

## 2015-09-23 LAB — GLUCOSE, CAPILLARY
GLUCOSE-CAPILLARY: 118 mg/dL — AB (ref 65–99)
GLUCOSE-CAPILLARY: 123 mg/dL — AB (ref 65–99)
GLUCOSE-CAPILLARY: 128 mg/dL — AB (ref 65–99)
GLUCOSE-CAPILLARY: 152 mg/dL — AB (ref 65–99)
Glucose-Capillary: 91 mg/dL (ref 65–99)

## 2015-09-23 MED ORDER — FOSFOMYCIN TROMETHAMINE 3 G PO PACK
3.0000 g | PACK | Freq: Once | ORAL | Status: AC
  Administered 2015-09-23: 3 g via ORAL
  Filled 2015-09-23: qty 3

## 2015-09-23 MED ORDER — SODIUM CHLORIDE 0.9 % IV BOLUS (SEPSIS)
250.0000 mL | Freq: Once | INTRAVENOUS | Status: AC
  Administered 2015-09-23: 250 mL via INTRAVENOUS

## 2015-09-23 MED ORDER — POTASSIUM CHLORIDE 20 MEQ PO PACK
40.0000 meq | PACK | Freq: Two times a day (BID) | ORAL | Status: DC
  Filled 2015-09-23 (×2): qty 2

## 2015-09-23 MED ORDER — OSMOLITE 1.5 CAL PO LIQD
320.0000 mL | Freq: Four times a day (QID) | ORAL | Status: DC
  Administered 2015-09-23 – 2015-09-24 (×3): 320 mL
  Filled 2015-09-23 (×9): qty 474

## 2015-09-23 MED ORDER — POTASSIUM CHLORIDE CRYS ER 20 MEQ PO TBCR
40.0000 meq | EXTENDED_RELEASE_TABLET | Freq: Two times a day (BID) | ORAL | Status: AC
  Administered 2015-09-23 (×2): 40 meq via ORAL
  Filled 2015-09-23 (×2): qty 2

## 2015-09-23 MED ORDER — SODIUM CHLORIDE 0.9 % IV BOLUS (SEPSIS)
250.0000 mL | Freq: Once | INTRAVENOUS | Status: AC
Start: 2015-09-23 — End: 2015-09-23
  Administered 2015-09-23: 250 mL via INTRAVENOUS

## 2015-09-23 NOTE — Progress Notes (Signed)
PT unresponsive and unable to cough  CPT held

## 2015-09-23 NOTE — Progress Notes (Signed)
After bolus Bos still in the 80s/50s. ELGERGAWY paged and he requested another bolus to be infused. Order placed. Will administer bolus and Will continue to monitor pt and pass along in report to next shift nurse.

## 2015-09-23 NOTE — Progress Notes (Signed)
Per Microbiology Molli Hazard(Matthew ext. [phone]) patient has gram + cocci but unable to get sensitivity due to equipment malfunction.  Would like to know if MD would like sensitivity, Elgergawy sent text page.

## 2015-09-23 NOTE — Progress Notes (Signed)
Nutrition Follow-up  DOCUMENTATION CODES:   Not applicable  INTERVENTION:  Administer bolus feeds of Osmolite 1.5 formula via PEG at 320 ml QID to provide 1920 kcal (100% of needs), 80 grams of protein, and 973 ml of free water.   If administering the boluses via pump, may infuse at 160 ml over a 2 hour time span.   Continue free water flushes. Once IV fluids are discontinued, increase water flushes to 200 ml QID given in between boluses.  RD to continue to monitor.   NUTRITION DIAGNOSIS:   Inadequate oral intake related to inability to eat as evidenced by NPO status; ongoing  GOAL:   Patient will meet greater than or equal to 90% of their needs; met  MONITOR:   Labs, I & O's, Weight trends, TF tolerance  REASON FOR ASSESSMENT:   Consult Enteral/tube feeding initiation and management  ASSESSMENT:   81 y.o. Male PMHx dementia, aspiration pneumonia -PEG dependant, prior stroke, HTN, CKD, DVT and PE who lives at [facility]. Pt presents after falling at nursing home and suffering a left hip fx.    PROCEDURE (10/25): Treatment of intertrochanteric, pertrochanteric, subtrochanteric fracture with intramedullary implant  Pt currently has bolus feeds of Osmolite 1.5 at 474 ml QID ordered, which has been providing 2844 kcal, 119 grams of protein, and 1440 ml of free water. This regimen has been over feeding patient. RD to adjust tube feeding orders to meet nutrition needs. Discussed with RN regarding new orders. Of note, bolus feeds have been administered via pump over a 2 hour period to aid in tolerance.   Nutrition-Focused physical exam completed. Findings are moderate fat depletion, moderate muscle depletion, and mild edema. Depletion may be related to the natural aging process.  RD to continue with current orders.   Labs and medications reviewed.   Diet Order:    NPO  Skin:  Reviewed, no issues  Last BM:  Unknown  Height:   Ht Readings from Last 1 Encounters:   09/20/15 5' 11"  (1.803 m)    Weight:   Wt Readings from Last 1 Encounters:  09/23/15 166 lb 10.7 oz (75.6 kg)  Admit weight: 150 lbs (68 kg)  Ideal Body Weight:  78.18 kg  BMI:  Body mass index is 23.26 kg/(m^2).  Estimated Nutritional Needs:   Kcal:  7482-7078  Protein:  80-100 grams  Fluid:  1.9 - 2.1 L/day  EDUCATION NEEDS:   No education needs identified at this time  Corrin Parker, MS, RD, LDN Pager # [phone] After hours/ weekend pager # [phone]

## 2015-09-23 NOTE — Evaluation (Signed)
Physical Therapy Evaluation Patient Details Name: Juan Owens MRN: [id number] DOB: Mar 26, 1931 Today's Date: 09/23/2015   History of Present Illness  Juan Owens is a 81 y.o. male who presents with left hip fx s/p mechanical fall yesterday. Has had chronic SDH from fall in the past that has been stable. Had IVC filter placed for LE DVT. Patient is ambulatory at baseline with walker and assistance. Patient is nonverbal secondary to severe dementia therefore ROS and other details of HPI are limited; now s/p surgery for Treatment of intertrochanteric, pertrochanteric, subtrochanteric fracture with intramedullary implant  Clinical Impression  Patient is s/p above surgery and with respiratory distress/pneumonia, resulting in functional limitations due to the deficits listed below (see PT Problem List).  Patient will benefit from skilled PT to increase their independence and safety with mobility to allow discharge to the venue listed below.       Follow Up Recommendations SNF    Equipment Recommendations  None recommended by PT    Recommendations for Other Services       Precautions / Restrictions Precautions Precautions: Fall Precaution Comments: PEG Restrictions LLE Weight Bearing: Weight bearing as tolerated      Mobility  Bed Mobility Overal bed mobility: Needs Assistance;+2 for physical assistance;+ 2 for safety/equipment Bed Mobility: Supine to Sit;Sit to Supine     Supine to sit: +2 for physical assistance;Total assist Sit to supine: +2 for physical assistance;Total assist   General bed mobility comments: Total assist for all aspects of bed mobiltiy  Transfers                    Ambulation/Gait                Stairs            Wheelchair Mobility    Modified Rankin (Stroke Patients Only)       Balance Overall balance assessment: Needs assistance Sitting-balance support: Bilateral upper extremity supported;Feet supported Sitting  balance-Leahy Scale: Zero (approaching poor) Sitting balance - Comments: Sat EOB less than 4 minutes; initially requiring heavy max assist to stay up due to posterior pushing, but then pt settled down and we were able to obtain a sitting BP Postural control: Posterior lean                                   Pertinent Vitals/Pain Pain Assessment: Faces Faces Pain Scale: Hurts even more Pain Location: Unable to specify; grimace and eyes open with PROM LLE, presumably pain at hip Pain Descriptors / Indicators: Grimacing;Guarding Pain Intervention(s): Limited activity within patient's tolerance;Monitored during session    Home Living Family/patient expects to be discharged to:: Skilled nursing facility                      Prior Function Level of Independence: Needs assistance   Gait / Transfers Assistance Needed: per notes, pt was ambulatory prior to fall     Comments: Unable to obtain PLOF from patient     Hand Dominance        Extremity/Trunk Assessment   Upper Extremity Assessment: Difficult to assess due to impaired cognition           Lower Extremity Assessment: LLE deficits/detail   LLE Deficits / Details: Grimace and guarding with Hip PROM     Communication   Communication: Expressive difficulties  Cognition Arousal/Alertness: Lethargic (eyes open approx 25% of session)  Behavior During Therapy:  (Eyes opened briefly while pt was EOB) Overall Cognitive Status: No family/caregiver present to determine baseline cognitive functioning       Memory: Decreased short-term memory (history of advanced dementia)              General Comments      Exercises        Assessment/Plan    PT Assessment Patient needs continued PT services (on a trial basis, depending on his ability to participate)  PT Diagnosis Difficulty walking;Generalized weakness;Acute pain   PT Problem List Decreased strength;Decreased range of motion;Decreased  activity tolerance;Decreased balance;Decreased mobility;Decreased coordination;Decreased cognition;Decreased knowledge of use of DME;Decreased safety awareness;Pain;Cardiopulmonary status limiting activity  PT Treatment Interventions DME instruction;Gait training;Functional mobility training;Therapeutic activities;Therapeutic exercise;Balance training;Neuromuscular re-education;Cognitive remediation;Patient/family education   PT Goals (Current goals can be found in the Care Plan section) Acute Rehab PT Goals Patient Stated Goal: did not state PT Goal Formulation: Patient unable to participate in goal setting Time For Goal Achievement: 10/07/15 Potential to Achieve Goals: Fair    Frequency Min 2X/week (on a trial basis, depending on ability to participate)   Barriers to discharge        Co-evaluation               End of Session Equipment Utilized During Treatment:  (bed pad) Activity Tolerance: Patient limited by lethargy Patient left: in bed;with call bell/phone within reach;Other (comment) (repositioned with pillows between LEs)           Time: [id number] PT Time Calculation (min) (ACUTE ONLY): 14 min   Charges:   PT Evaluation $Initial PT Evaluation Tier I: 1 Procedure     PT G CodesVan Owens:        Juan Owens 09/23/2015, 4:31 PM  Juan ClinesHolly Jacayla Owens, [place]PT  Acute Rehabilitation Services Pager [phone] Office [phone]

## 2015-09-23 NOTE — Progress Notes (Signed)
Name: Juan Owens MRN: [id number] DOB: 01-28-1931    ADMISSION DATE:  09/18/2015 CONSULTATION DATE:  10/24  REFERRING MD :  [email or web address]   CHIEF COMPLAINT:  Pre-op clearance   BRIEF PATIENT DESCRIPTION: 81yo male former smoker with hx stroke, HTN, chronic stable SDH, dysphagia with chronic PEG, DVT s/p IVC filter, severe dementia presented 10/22 after a fall at SNF with resultant L hip fx.  Was awaiting hip repair but developed some dyspnea, hypoxia and PCCM consulted 10/24 for assistance pre-op.   Per daughter pt is nonverbal at baseline but is ambulatory.  He tripped and fell, no LOC, no obvious pre-syncope.  Chronic PEG.    SIGNIFICANT EVENTS  10/24 surgery deferred due to hypoxia   STUDIES:  Pelvis xray 10/22>> Comminuted, intertrochanteric left hip fracture CT head 10/22>> Vague 6 mm subdural hematoma overlying the left parietal lobe may be subacute in nature. No additional evidence for traumatic injury to the head. 2D echo 10/22>> grade 1 diastolic dysfunction, mod pulm HTN CT chest 10/23>>> Bilat lower lobe consolidation, COPD, 6mm RUL nodule   SUBJECTIVE:  Nonverbal but less awake, responsive  No obvious pain Breathing appears comfortable on 3LNC  VITAL SIGNS: Temp:  [97.7 F (36.5 C)-98.7 F (37.1 C)] 98.7 F (37.1 C) (10/26 0441) Pulse Rate:  [64-94] 94 (10/26 0441) Resp:  [16-20] 16 (10/26 0441) BP: (85-150)/(45-73) 93/50 mmHg (10/26 0743) SpO2:  [95 %-100 %] 95 % (10/26 0441) Weight:  [166 lb 10.7 oz (75.6 kg)] 166 lb 10.7 oz (75.6 kg) (10/26 0500)  PHYSICAL EXAMINATION: General:  Frail, chronically ill appearing male, NAD  Neuro:  Nonverbal at baseline, now lethargic, opens eyes to loud voice, withdraws to pain, does not follow any commands HEENT:  Mm moist, no JVD  Cardiovascular:  [id number] Lungs:  resps even, non labored on 3L [place], diminished bases, few scattered rhonchi  Abdomen:  Round, soft, PEG Musculoskeletal:  Warm bilat, dry, no edema      Recent Labs Lab 09/20/15 1015 09/22/15 0530 09/23/15 0305  NA 145 146* 141  K 3.6 2.8* 3.1*  CL 115* 112* 106  CO2 BUN CREATININE 0.64 0.80 0.68  GLUCOSE 107* 90 151*    Recent Labs Lab 09/20/15 0428 09/22/15 0530 09/23/15 0305  HGB 9.5* 7.9* 9.2*  HCT 29.4* 24.6* 28.9*  WBC 7.3 9.0 10.2  PLT 112* 109* 122*   Dg Hip Operative Unilat With Pelvis Left  09/22/2015  CLINICAL DATA:  81 year old male status post ORIF of the left hip. EXAM: OPERATIVE LEFT HIP (WITH PELVIS IF PERFORMED) 2 VIEWS TECHNIQUE: Fluoroscopic spot image(s) were submitted for interpretation post-operatively. COMPARISON:  09/18/2015. FINDINGS: Two intraoperative spot views of the left hip demonstrate interval placement of an intra medullary rod and screw fixation device traversing the previously noted comminuted intertrochanteric left hip fracture, with restoration of anatomic alignment. No acute complicating features. Femoral head is properly located. IMPRESSION: 1. Intraoperative documentation of ORIF for left hip fracture, as above. Electronically Signed   By: Trudie Reed M.D.   On: 09/22/2015 18:51    ASSESSMENT / PLAN:  Acute hypoxic respiratory failure - HCAP v atx.  Currently requiring 6L [place] with O2 sats 88-90%, mildly tachypneic.  HCAP vs aspiration-bibasal consolidation versus atelectasis, better seen on CT Chronic SDH  Anemia -- hgb stable post op  Severe dementia  Dysphagia - chronic PEG  Left Hip Fx - s/p repair 10/25  REC -  Supplemental O2 as needed  to keep sats >90% Continue broad spectrum abx for HCAP  Aggressive pulm hygiene as able -- attempt IS, flutter but highly doubt he can cooperate with this  Attempt chest vest - again doubt he will be cooperative with this  Mobilize as able when ok with ortho - severe dementia but ambulatory previously Was previously DNR - assume was reversed to full code for OR, need to confirm with daughter and revert back to  DNR  PCCM signing off, please call back if needed.    Dirk DressKaty Malakie Balis, NP 09/23/2015  10:39 AM Pager: [phone] or [phone]

## 2015-09-23 NOTE — Progress Notes (Signed)
   Subjective:  Patient resting in bed.  Objective:   VITALS:   Filed Vitals:   09/23/15 0031 09/23/15 0441 09/23/15 0453 09/23/15 0500  BP: 137/70 87/45 88/58    Pulse: 86 94    Temp: 98.3 F (36.8 C) 98.7 F (37.1 C)    TempSrc: Axillary Axillary    Resp: 16 16    Height:      Weight:    75.6 kg (166 lb 10.7 oz)  SpO2:  95%      Sensation intact distally Intact pulses distally Dorsiflexion/Plantar flexion intact Incision: dressing C/D/I and no drainage No cellulitis present Compartment soft   Lab Results  Component Value Date   WBC 10.2 09/23/2015   HGB 9.2* 09/23/2015   HCT 28.9* 09/23/2015   MCV 96.3 09/23/2015   PLT 122* 09/23/2015     Assessment/Plan:  1 Day Post-Op   - Expected postop acute blood loss anemia - will monitor for symptoms - Up with PT/OT - DVT ppx - SCDs, ambulation, asa - patient has chronic SDH and IVC filter - high risk of bleeding  - WBAT operative extremity - Pain control   Cheral AlmasXu, Naiping Michael 09/23/2015, 6:58 AM [phone]

## 2015-09-23 NOTE — Progress Notes (Signed)
Patient post-surgery. Will hold CPT tonight. Patient non-verbal with clear/diminished breath sounds and non-productive cough. RT will continue to monitor.

## 2015-09-23 NOTE — Progress Notes (Signed)
Pt BPss running 80s/50s, urine output since 8pm is , no acute mental status change. [facility] paged and order placed for 250 ML bolus, will administer and continue to monitor pt.

## 2015-09-23 NOTE — Progress Notes (Addendum)
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 Due to multiple significant comorbidities, he was felt to to be high risk and hence was transferred to [facility] for further evaluation and management. Did not use oxygen prior to admission. Noted to be hypoxic with oxygen saturations in the 88-90 percent on 6 L/m oxygen via nasal cannula. CCM consulted and surgery was postponed by a day for stabilization. Patient is high risk for surgical fixation, family accepting risk, underwent hip surgical repair on 10/25.  Assessment/Plan:  Acute hypoxic respiratory failure - Secondary to aspiration versus healthcare associated pneumonia versus atelectasis. - Continue broad-spectrum IV antibiotics i.e. vancomycin and Zosyn, supplemental oxygen, aggressive pulmonary toilet as able-patient most likely will not cooperate for incentive spirometry or chest vest - PCCM follow up appreciated - Improving. Saturating in the high 90s on 2 L oxygen this morning.   Aspiration pneumonia versus HCAP - Management as above - Blood cultures 2: Negative to date.   Sepsis - Likely secondary to pneumonia. Present on admission but since then has resolved. - Urine culture growing enterococcus species, a shunt was treated with Zosyn, we'll give fosfomycin . - Patient was hypotensive overnight required fluid boluses 2  Left hip fracture - Overall patient is at high risk for perioperative complications from his current tenuous respiratory status. Patient ambulated prior to his fall and hip fracture and hence hip fracture surgery will be palliative for pain control  and mobilization.  - Transfused 1 unit PRBC prior to surgery. - Status post surgical repair 10/25 by Dr. Roda Shutters  Chronic subdural hematoma - Prior hospitalist colleagues have discussed with neurosurgery and no intervention recommended.  Chronic dysphagia - On PEG tube feeding-currently on hold for surgery. To be resumed postop when appropriate .  Anemia - hemoglobin has progressively dropped from 12.8 on admission to 7.9 on 10/25. No overt source noted. Possibly from left hip fracture. As discussed with orthopedics, transfused 1 unit PRBC prior to surgery. Hemoglobin is 9.2 with this a.m.  Thrombocytopenia - Unclear etiology. Platelet count has also gradually dropped from 156-109. Follow CBCs  Advanced dementia with behavioral disturbances and depression - Mental status possibly at baseline.  Right upper lobe pulmonary nodule, seen on CT - No further follow-up since patient will not be a candidate for any aggressive treatment  Hypokalemia - Repleted, recheck in a.m.   DVT prophylaxis: SCDs Code Status: DO NOT RESUSCITATE Family Communication: None at bedside Disposition Plan: DC back to SNF when medically stable   Consultants:  Orthopedics  PCCM  Procedures:  Right upper extremity PICC, 09/19/15  2-D echo 10/22: Study Conclusions - Left ventricle: The cavity size was normal. Wall thickness was normal. Systolic function was normal. The estimated ejection fraction was in the range of 55% to 60%. Wall motion was normal; there were no regional wall motion abnormalities. Doppler parameters are consistent with abnormal left ventricular relaxation (grade 1 diastolic dysfunction). - Right ventricle: The cavity size was normal. Wall thickness was increased. - Right atrium: The atrium was mildly dilated. - Pulmonary arteries: Systolic pressure was moderately increased. PA peak pressure: 55 mm Hg (S).  Foley catheter  Antibiotics:  Vancomycin 10/22>>  Zosyn  10/22 >>  Fosfomycin once 10/26  Subjective: Patient mostly nonverbal this morning. As per nursing, no acute issues.   Objective: Filed Vitals:   09/23/15 0453 09/23/15 0500 09/23/15 0704 09/23/15 0743  BP: 88/58  85/51 93/50  Pulse:      Temp:      TempSrc:      Resp:      Height:      Weight:  75.6 kg (166 lb 10.7 oz)    SpO2:        Intake/Output Summary (Last 24 hours) at 09/23/15 1251 Last data filed at 09/23/15 0538  Gross per 24 hour  Intake   2335 ml  Output    350 ml  Net   1985 ml   Filed Weights   09/21/15 0434 09/22/15 0444 09/23/15 0500  Weight: 70.7 kg (155 lb 13.8 oz) 73.3 kg (161 lb 9.6 oz) 75.6 kg (166 lb 10.7 oz)     Exam:  General exam: Elderly frail male lying comfortably propped up in bed Respiratory system: Reduced breath sounds in the bases with few basal crackles. Rest of lung fields clear to auscultation. No increased work of breathing. Cardiovascular system: S1 & S2 heard, RRR. No JVD, murmurs, gallops, clicks or pedal edema.  Gastrointestinal system: Abdomen is nondistended, soft and nontender. Normal bowel sounds heard. Central nervous system: Alert but mostly non verbal and does not follow instructions. No focal neurological deficits. Extremities: Moves all limbs symmetrically except left lower extremity with movements limited by pain.   Data Reviewed: Basic Metabolic Panel:  Recent Labs Lab 09/19/15 0429 09/20/15 0428 09/20/15 1015 09/22/15 0530 09/22/15 1035 09/23/15 0305  NA 142 142 145 146*  --  141  K 4.3 3.6 3.6 2.8*  --  3.1*  CL 111 113* 115* 112*  --  106  CO2 --  25  GLUCOSE 113* 109* 107* 90  --  151*  BUN 22* --  13  CREATININE 0.77 0.70 0.64 0.80  --  0.68  CALCIUM 9.1 8.4* 8.3* 8.5*  --  8.5*  MG  --   --   --   --  1.7  --    Liver Function Tests:  Recent Labs Lab 09/18/15 2325 09/20/15 0428  AST 30 25  ALT 22 14*  ALKPHOS 113 73  BILITOT 0.9 1.1  PROT 7.2 5.6*  ALBUMIN 4.0  2.9*   No results for input(s): LIPASE, AMYLASE in the last 168 hours. No results for input(s): AMMONIA in the last 168 hours. CBC:  Recent Labs Lab 09/18/15 2325 09/19/15 0429 09/20/15 0428 09/22/15 0530 09/23/15 0305  WBC 13.3* 9.0 7.3 9.0 10.2  NEUTROABS 11.8*  --   --   --   --   HGB 12.8* 11.3* 9.5* 7.9* 9.2*  HCT 39.3 35.4* 29.4* 24.6* 28.9*  MCV 98.5 99.2 99.7 98.0 96.3  PLT 156 141* 112* 109* 122*   Cardiac Enzymes: No results for input(s): CKTOTAL, CKMB, CKMBINDEX, TROPONINI in the last 168 hours. BNP (last 3 results) No results for input(s): PROBNP in the last 8760 hours. CBG:  Recent Labs Lab 09/22/15 1134 09/22/15 1555 09/22/15 2056 09/23/15 0028 09/23/15 0447  GLUCAP 79 81 90 91 152*    Recent Results (from the past 240 hour(s))  Blood culture (routine x 2)     Status: None (Preliminary result)   Collection Time: 09/19/15 12:09 AM  Result Value Ref Range Status  Specimen Description BLOOD LEFT ARM  Final   Special Requests BOTTLES DRAWN AEROBIC AND ANAEROBIC 8CC  Final   Culture NO GROWTH 4 DAYS  Final   Report Status PENDING  Incomplete  Blood culture (routine x 2)     Status: None (Preliminary result)   Collection Time: 09/19/15 12:13 AM  Result Value Ref Range Status   Specimen Description BLOOD LEFT HAND  Final   Special Requests BOTTLES DRAWN AEROBIC AND ANAEROBIC 8CC  Final   Culture NO GROWTH 4 DAYS  Final   Report Status PENDING  Incomplete  Urine culture     Status: None   Collection Time: 09/19/15 12:58 AM  Result Value Ref Range Status   Specimen Description URINE, CATHETERIZED  Final   Special Requests NONE  Final   Culture   Final    >=100,000 COLONIES/mL ENTEROCOCCUS SPECIES UNABLE TO OBTAIN SENSITIVITY PATTERN Performed at [facility]    Report Status 09/23/2015 FINAL  Final  MRSA PCR Screening     Status: Abnormal   Collection Time: 09/19/15  1:51 AM  Result Value Ref Range Status   MRSA by PCR POSITIVE (A)  NEGATIVE Final    Comment:        The GeneXpert MRSA Assay (FDA approved for NASAL specimens only), is one component of a comprehensive MRSA colonization surveillance program. It is not intended to diagnose MRSA infection nor to guide or monitor treatment for MRSA infections. RESULT CALLED TO, READ BACK BY AND VERIFIED WITH:  HAMMOCK,S @ 0455 ON 09/19/15 BY WOODIE,J            Studies: Dg Hip Operative Unilat With Pelvis Left  09/22/2015  CLINICAL DATA:  81 year old male status post ORIF of the left hip. EXAM: OPERATIVE LEFT HIP (WITH PELVIS IF PERFORMED) 2 VIEWS TECHNIQUE: Fluoroscopic spot image(s) were submitted for interpretation post-operatively. COMPARISON:  09/18/2015. FINDINGS: Two intraoperative spot views of the left hip demonstrate interval placement of an intra medullary rod and screw fixation device traversing the previously noted comminuted intertrochanteric left hip fracture, with restoration of anatomic alignment. No acute complicating features. Femoral head is properly located. IMPRESSION: 1. Intraoperative documentation of ORIF for left hip fracture, as above. Electronically Signed   By: Trudie Reedaniel  Entrikin M.D.   On: 09/22/2015 18:51        Scheduled Meds: . sodium chloride   Intravenous Once  . antiseptic oral rinse  7 mL Mouth Rinse q12n4p  . aspirin EC  325 mg Oral Daily  . chlorhexidine  15 mL Mouth Rinse BID  . feeding supplement (OSMOLITE 1.5 CAL)  320 mL Per Tube QID  . free water  100 mL Per Tube 3 times per day  . mupirocin ointment  1 application Nasal BID  . pantoprazole sodium  40 mg Oral BID AC  . piperacillin-tazobactam (ZOSYN)  IV  3.375 g Intravenous Q8H  . potassium chloride  40 mEq Oral BID  . sertraline  25 mg Oral Daily  . sodium chloride  10-40 mL Intracatheter Q12H  . valproic acid  125 mg Oral BID  . vancomycin  750 mg Intravenous Q12H   Continuous Infusions: . sodium chloride    . lactated ringers 50 mL/hr at 09/22/15 1642     Principal Problem:   Sepsis due to undetermined organism [facility](HCC) Active Problems:   S/P percutaneous endoscopic gastrostomy (PEG) tube placement [facility](HCC)   Intertrochanteric fracture of left hip (HCC)   Dementia with behavioral disturbance   Acute respiratory failure  with hypoxia (HCC)   Thrombocytopenia (HCC)   Subdural hematoma (HCC)   Pulmonary hypertension (HCC)   Normocytic anemia   Abnormal CXR   Pneumonia   Pre-op evaluation   Hospital acquired PNA   Hypokalemia    Time spent: 25 minutes    ELGERGAWY, DAWOOD, MD,  [facility] Pager [phone]  If 7PM-7AM, please contact night-coverage [email or web address] Password [email or web address] 09/23/2015, 12:51 PM    LOS: 4 days

## 2015-09-23 NOTE — Progress Notes (Signed)
PT Cancellation Note  Patient Details Name: Juan PeaceHenry Mccormack MRN: [id number] DOB: 06/11/1931   Cancelled Treatment:    Reason Eval/Treat Not Completed: Patient not medically ready   Currently hypotensive, and being closely monitored;   Will follow up later today for appropriateness of PT eval as time allows;  Otherwise, will follow up for PT tomorrow;   Thank you,  Van ClinesHolly Adalida Garver, PT  Acute Rehabilitation Services Pager [phone] Office [phone]     Van ClinesGarrigan, Madalee Altmann [facility] 09/23/2015, 8:22 AM

## 2015-09-24 ENCOUNTER — Inpatient Hospital Stay (HOSPITAL_COMMUNITY): Payer: Medicare Other

## 2015-09-24 LAB — BASIC METABOLIC PANEL
Anion gap: 4 — ABNORMAL LOW (ref 5–15)
BUN: 12 mg/dL (ref 6–20)
CALCIUM: 7.6 mg/dL — AB (ref 8.9–10.3)
CO2: 28 mmol/L (ref 22–32)
CREATININE: 0.82 mg/dL (ref 0.61–1.24)
Chloride: 108 mmol/L (ref 101–111)
GFR calc non Af Amer: >60 mL/min (ref >60)
GLUCOSE: 135 mg/dL — AB (ref 65–99)
Potassium: 3 mmol/L — ABNORMAL LOW (ref 3.5–5.1)
Sodium: 140 mmol/L (ref 135–145)

## 2015-09-24 LAB — CULTURE, BLOOD (ROUTINE X 2)
CULTURE: NO GROWTH
CULTURE: NO GROWTH

## 2015-09-24 LAB — CBC
HEMATOCRIT: 19.7 % — AB (ref 39.0–52.0)
Hemoglobin: 6.3 g/dL — CL (ref 13.0–17.0)
MCH: 30.4 pg (ref 26.0–34.0)
MCHC: 32 g/dL (ref 30.0–36.0)
MCV: 95.2 fL (ref 78.0–100.0)
Platelets: 151 10*3/uL (ref 150–400)
RBC: 2.07 MIL/uL — ABNORMAL LOW (ref 4.22–5.81)
RDW: 15.6 % — AB (ref 11.5–15.5)
WBC: 9.1 10*3/uL (ref 4.0–10.5)

## 2015-09-24 LAB — PREPARE RBC (CROSSMATCH)

## 2015-09-24 LAB — GLUCOSE, CAPILLARY
GLUCOSE-CAPILLARY: 95 mg/dL (ref 65–99)
GLUCOSE-CAPILLARY: 94 mg/dL (ref 65–99)
Glucose-Capillary: 121 mg/dL — ABNORMAL HIGH (ref 65–99)

## 2015-09-24 MED ORDER — ASPIRIN 81 MG PO CHEW: 324.0000 mg | CHEWABLE_TABLET | Freq: Every day | ORAL | Status: DC

## 2015-09-24 MED ORDER — POTASSIUM CHLORIDE 20 MEQ/15ML (10%) PO SOLN
40.0000 meq | ORAL | Status: AC
  Administered 2015-09-24 (×2): 40 meq
  Filled 2015-09-24 (×3): qty 30

## 2015-09-24 MED ORDER — SODIUM CHLORIDE 0.9 % IV SOLN
Freq: Once | INTRAVENOUS | Status: AC
  Administered 2015-09-24: 12:00:00 via INTRAVENOUS

## 2015-09-24 MED ORDER — TWOCAL HN PO LIQD
237.0000 mL | Freq: Four times a day (QID) | ORAL | Status: DC
  Administered 2015-09-24 – 2015-09-26 (×8): 237 mL
  Filled 2015-09-24 (×14): qty 237

## 2015-09-24 NOTE — Progress Notes (Signed)
   Subjective:  Patient resting in bed.  Objective:   VITALS:   Filed Vitals:   09/23/15 1525 09/23/15 1814 09/23/15 2332 09/24/15 0728  BP: 101/53 97/45 106/50 122/64  Pulse: 96 88 87 95  Temp:  99.4 F (37.4 C) 97.8 F (36.6 C) 100.3 F (37.9 C)  TempSrc:  Axillary Axillary Axillary  Resp:  18 18   Height:      Weight:    74.4 kg (164 lb 0.4 oz)  SpO2:  98% 95% 98%    Sensation intact distally Intact pulses distally Dorsiflexion/Plantar flexion intact Incision: dressing C/D/I and no drainage No cellulitis present Compartment soft   Lab Results  Component Value Date   WBC 9.1 09/24/2015   HGB 6.3* 09/24/2015   HCT 19.7* 09/24/2015   MCV 95.2 09/24/2015   PLT 151 09/24/2015     Assessment/Plan:  2 Days Post-Op   - Hgb 6.3 - needs 2 units of blood - dressing change ordered - SNF pending   Cheral AlmasXu, Naiping Michael 09/24/2015, 7:47 AM [phone]

## 2015-09-24 NOTE — Clinical Social Work Placement (Signed)
   CLINICAL SOCIAL WORK PLACEMENT  NOTE  Date:  09/24/2015  Patient Details  Name: Juan Owens MRN: [id number] Date of Birth: 1930/12/05  Clinical Social Work is seeking post-discharge placement for this patient at the Skilled  Nursing Facility level of care (*CSW will initial, date and re-position this form in  chart as items are completed):  Yes   Patient/family provided with [place] Clinical Social Work Department's list of facilities offering this level of care within the geographic area requested by the patient (or if unable, by the patient's family).  Yes   Patient/family informed of their freedom to choose among providers that offer the needed level of care, that participate in Medicare, Medicaid or managed care program needed by the patient, have an available bed and are willing to accept the patient.  Yes   Patient/family informed of [place]'s ownership interest in [facility] and [facility], as well as of the fact that they are under no obligation to receive care at these facilities.  PASRR submitted to EDS on       PASRR number received on       Existing PASRR number confirmed on 09/24/15     FL2 transmitted to all facilities in geographic area requested by pt/family on 09/24/15     FL2 transmitted to all facilities within larger geographic area on 09/24/15     Patient informed that his/her managed care company has contracts with or will negotiate with certain facilities, including the following:            Patient/family informed of bed offers received.  Patient chooses bed at       Physician recommends and patient chooses bed at      Patient to be transferred to   on  .  Patient to be transferred to facility by       Patient family notified on   of transfer.  Name of family member notified:        PHYSICIAN Please sign FL2, Please sign DNR     Additional Comment:    _______________________________________________ Rod MaeVaughn, Kai Railsback S,  LCSW 09/24/2015, 3:15 PM

## 2015-09-24 NOTE — Progress Notes (Addendum)
PROGRESS NOTE    Juan Owens WUX:[id number] DOB: [birth year] DOA: 09/18/2015 PCP: Pearson Grippe, MD  HPI/Brief narrative 81 year old male patient with history of advanced dementia, DVT & PE status post IVC filter, stroke, HTN, dysphagia status post PEG tube, aspiration pneumonia, chronic subdural hematoma, former smoker, presented to [facility] on 09/19/15 after a fall at SNF and resultant left hip fracture. Due to multiple significant comorbidities, he was felt to to be high risk and hence was transferred to [facility] for further evaluation and management. Did not use oxygen prior to admission. Noted to be hypoxic with oxygen saturations in the 88-90 percent on 6 L/m oxygen via nasal cannula. CCM consulted and surgery was postponed by a day for stabilization. Patient is high risk for surgical fixation, family accepting risk, underwent hip surgical repair on 10/25.  Assessment/Plan:  Acute hypoxic respiratory failure - Secondary to aspiration versus healthcare associated pneumonia versus atelectasis. - treated with  broad-spectrum IV antibiotics i.e. vancomycin and Zosyn for total of 5 days, supplemental oxygen, aggressive pulmonary toilet as able-patient most likely will not cooperate for incentive spirometry or chest vest - PCCM follow up appreciated - Improving.    Aspiration pneumonia versus HCAP - Management as above - Blood cultures 2: Negative to date.   Sepsis - Likely secondary to pneumonia. Present on admission but since then has resolved. - Urine culture growing enterococcus species, a shunt was treated with Zosyn, we'll give fosfomycin . - Patient was hypotensive overnight required fluid boluses 2  Left hip fracture - Overall patient is at high risk for perioperative complications from his current tenuous respiratory status. Patient ambulated prior to his fall and hip fracture and hence hip fracture surgery will be palliative for pain control and mobilization.  -  Transfused 1 unit PRBC prior to surgery. - Status post surgical repair 10/25 by Dr. Roda Shutters  Chronic subdural hematoma - Prior hospitalist colleagues have discussed with neurosurgery and no intervention recommended.  Chronic dysphagia - On PEG tube feeding.   Anemia - hemoglobin has progressively dropped from 12.8 on admission to 7.9 on 10/25. No overt source noted. Possibly from left hip fracture. As Dr. Waymon Amato discussed with orthopedics, transfused 1 unit PRBC prior to surgery. Hemoglobin improved to 9.2 after transfusion. hemoglobin is 6.3 today (this is most likely multifactorial as delusional and postoperative blood loss), will transfuse 2 units PRBC, will check CT left hip to evaluate for possible hematoma. - We'll hold aspirin  Thrombocytopenia - Unclear etiology. Stable, continue to follow.  Advanced dementia with behavioral disturbances and depression - Mental status possibly at baseline.  Right upper lobe pulmonary nodule, seen on CT - No further follow-up since patient will not be a candidate for any aggressive treatment  Hypokalemia - Repleted,    DVT prophylaxis: SCDs Code Status: DO NOT RESUSCITATE Family Communication: Cost with daughter over the phone Disposition Plan: DC back to SNF when medically stable   Consultants:  Orthopedics  PCCM  Procedures:  Right upper extremity PICC, 09/19/15  2-D echo 10/22: Study Conclusions - Left ventricle: The cavity size was normal. Wall thickness was normal. Systolic function was normal. The estimated ejection fraction was in the range of 55% to 60%. Wall motion was normal; there were no regional wall motion abnormalities. Doppler parameters are consistent with abnormal left ventricular relaxation (grade 1 diastolic dysfunction). - Right ventricle: The cavity size was normal. Wall thickness was increased. - Right atrium: The atrium was mildly dilated. - Pulmonary arteries: Systolic pressure was  moderately increased. PA peak pressure: 55 mm Hg (S).  Foley catheter  Antibiotics:  Vancomycin 10/22>> 10/26  Zosyn 10/22 >> 10/27  Fosfomycin once 10/26  Subjective: Patient mostly nonverbal this morning. As per nursing, no acute issues.   Objective: Filed Vitals:   09/23/15 2332 09/24/15 0728 09/24/15 1230 09/24/15 1245  BP: 106/50 122/64 101/56 104/58  Pulse: 87 95 82 82  Temp: 97.8 F (36.6 C) 100.3 F (37.9 C) 98.6 F (37 C) 98.4 F (36.9 C)  TempSrc: Axillary Axillary Axillary Axillary  Resp: 18     Height:      Weight:  74.4 kg (164 lb 0.4 oz)    SpO2: 95% 98% 95% 97%    Intake/Output Summary (Last 24 hours) at 09/24/15 1314 Last data filed at 09/24/15 1245  Gross per 24 hour  Intake 2030.83 ml  Output    725 ml  Net 1305.83 ml   Filed Weights   09/22/15 0444 09/23/15 0500 09/24/15 0728  Weight: 73.3 kg (161 lb 9.6 oz) 75.6 kg (166 lb 10.7 oz) 74.4 kg (164 lb 0.4 oz)     Exam:  General exam: Elderly frail male lying comfortably propped up in bed Respiratory system: Reduced breath sounds in the bases with few basal crackles. Rest of lung fields clear to auscultation. No increased work of breathing. Cardiovascular system: S1 & S2 heard, RRR. No JVD, murmurs, gallops, clicks or pedal edema.  Gastrointestinal system: Abdomen is nondistended, soft and nontender. Normal bowel sounds heard. Central nervous system: Alert but mostly non verbal and does not follow instructions. No focal neurological deficits. Extremities: Moves all limbs symmetrically except left lower extremity with movements limited by pain.   Data Reviewed: Basic Metabolic Panel:  Recent Labs Lab 09/20/15 0428 09/20/15 1015 09/22/15 0530 09/22/15 1035 09/23/15 0305 09/24/15 0504  NA 142 145 146*  --  141 140  K 3.6 3.6 2.8*  --  3.1* 3.0*  CL 113* 115* 112*  --  106 108  CO2 24 24 26   --  25 28  GLUCOSE 109* 107* 90  --  151* 135*  BUN 13 11 12   --  13 12  CREATININE 0.70  0.64 0.80  --  0.68 0.82  CALCIUM 8.4* 8.3* 8.5*  --  8.5* 7.6*  MG  --   --   --  1.7  --   --    Liver Function Tests:  Recent Labs Lab 09/18/15 2325 09/20/15 0428  AST 30 25  ALT 22 14*  ALKPHOS 113 73  BILITOT 0.9 1.1  PROT 7.2 5.6*  ALBUMIN 4.0 2.9*   No results for input(s): LIPASE, AMYLASE in the last 168 hours. No results for input(s): AMMONIA in the last 168 hours. CBC:  Recent Labs Lab 09/18/15 2325 09/19/15 0429 09/20/15 0428 09/22/15 0530 09/23/15 0305 09/24/15 0504  WBC 13.3* 9.0 7.3 9.0 10.2 9.1  NEUTROABS 11.8*  --   --   --   --   --   HGB 12.8* 11.3* 9.5* 7.9* 9.2* 6.3*  HCT 39.3 35.4* 29.4* 24.6* 28.9* 19.7*  MCV 98.5 99.2 99.7 98.0 96.3 95.2  PLT 156 141* 112* 109* 122* 151   Cardiac Enzymes: No results for input(s): CKTOTAL, CKMB, CKMBINDEX, TROPONINI in the last 168 hours. BNP (last 3 results) No results for input(s): PROBNP in the last 8760 hours. CBG:  Recent Labs Lab 09/23/15 1620 09/23/15 2342 09/24/15 0405 09/24/15 0801 09/24/15 1142  GLUCAP 128* 123* 121* 95 94  Recent Results (from the past 240 hour(s))  Blood culture (routine x 2)     Status: None   Collection Time: 09/19/15 12:09 AM  Result Value Ref Range Status   Specimen Description BLOOD LEFT ARM  Final   Special Requests BOTTLES DRAWN AEROBIC AND ANAEROBIC 8CC  Final   Culture NO GROWTH 5 DAYS  Final   Report Status 09/24/2015 FINAL  Final  Blood culture (routine x 2)     Status: None   Collection Time: 09/19/15 12:13 AM  Result Value Ref Range Status   Specimen Description BLOOD LEFT HAND  Final   Special Requests BOTTLES DRAWN AEROBIC AND ANAEROBIC 8CC  Final   Culture NO GROWTH 5 DAYS  Final   Report Status 09/24/2015 FINAL  Final  Urine culture     Status: None   Collection Time: 09/19/15 12:58 AM  Result Value Ref Range Status   Specimen Description URINE, CATHETERIZED  Final   Special Requests NONE  Final   Culture   Final    >=100,000 COLONIES/mL  ENTEROCOCCUS SPECIES UNABLE TO OBTAIN SENSITIVITY PATTERN Performed at [facility]    Report Status 09/23/2015 FINAL  Final  MRSA PCR Screening     Status: Abnormal   Collection Time: 09/19/15  1:51 AM  Result Value Ref Range Status   MRSA by PCR POSITIVE (A) NEGATIVE Final    Comment:        The GeneXpert MRSA Assay (FDA approved for NASAL specimens only), is one component of a comprehensive MRSA colonization surveillance program. It is not intended to diagnose MRSA infection nor to guide or monitor treatment for MRSA infections. RESULT CALLED TO, READ BACK BY AND VERIFIED WITH:  HAMMOCK,S @ 0455 ON 09/19/15 BY WOODIE,J            Studies: Dg Hip Operative Unilat With Pelvis Left  09/22/2015  CLINICAL DATA:  81 year old male status post ORIF of the left hip. EXAM: OPERATIVE LEFT HIP (WITH PELVIS IF PERFORMED) 2 VIEWS TECHNIQUE: Fluoroscopic spot image(s) were submitted for interpretation post-operatively. COMPARISON:  09/18/2015. FINDINGS: Two intraoperative spot views of the left hip demonstrate interval placement of an intra medullary rod and screw fixation device traversing the previously noted comminuted intertrochanteric left hip fracture, with restoration of anatomic alignment. No acute complicating features. Femoral head is properly located. IMPRESSION: 1. Intraoperative documentation of ORIF for left hip fracture, as above. Electronically Signed   By: Trudie Reedaniel  Entrikin M.D.   On: 09/22/2015 18:51        Scheduled Meds: . sodium chloride   Intravenous Once  . antiseptic oral rinse  7 mL Mouth Rinse q12n4p  . aspirin  324 mg Oral Daily  . chlorhexidine  15 mL Mouth Rinse BID  . feeding supplement (OSMOLITE 1.5 CAL)  320 mL Per Tube QID  . free water  100 mL Per Tube 3 times per day  . pantoprazole sodium  40 mg Oral BID AC  . potassium chloride  40 mEq Per Tube Q4H  . sertraline  25 mg Oral Daily  . sodium chloride  10-40 mL Intracatheter Q12H  .  TWOCAL HN  237 mL Per Tube Q6H  . valproic acid  125 mg Oral BID   Continuous Infusions: . sodium chloride Stopped (09/24/15 0401)  . lactated ringers 50 mL/hr at 09/22/15 1642    Principal Problem:   Sepsis due to undetermined organism [facility](HCC) Active Problems:   S/P percutaneous endoscopic gastrostomy (PEG) tube placement (HCC)  Intertrochanteric fracture of left hip (HCC)   Dementia with behavioral disturbance   Acute respiratory failure with hypoxia (HCC)   Thrombocytopenia (HCC)   Subdural hematoma (HCC)   Pulmonary hypertension (HCC)   Normocytic anemia   Abnormal CXR   Pneumonia   Pre-op evaluation   Hospital acquired PNA   Hypokalemia   HCAP (healthcare-associated pneumonia)    Time spent: 25 minutes    Elbie Statzer, MD,  [facility] Pager [phone]  If 7PM-7AM, please contact night-coverage [email or web address] Password [email or web address] 09/24/2015, 1:14 PM    LOS: 5 days

## 2015-09-24 NOTE — Progress Notes (Signed)
Unable to get vest behind pt with pt lethargic. Attempted manual CPT for only a minute. PT swatting hands and trying to hold mine down away from him. Discontinued CPT and tried to get pt to cough. PT would not cough.

## 2015-09-24 NOTE — NC FL2 (Signed)
[place] MEDICAID FL2 LEVEL OF CARE SCREENING TOOL     IDENTIFICATION  Patient Name: Juan Owens Birthdate: [birth year] Sex: male Admission Date (Current Location): 09/18/2015  [facility] and [place] Number:     Facility and Address:  [facility], [address] [place], [place] [address]      Provider Number: [id number]  Attending Physician Name and Address:  Starleen Arms, MD  Relative Name and Phone Number:  Linus Orn, [phone]    Current Level of Care: Hospital Recommended Level of Care: Skilled Nursing Facility Prior Approval Number:    Date Approved/Denied:   PASRR Number: [id number]  Discharge Plan: SNF    Current Diagnoses: Patient Active Problem List   Diagnosis Date Noted  . HCAP (healthcare-associated pneumonia)   . Hospital acquired PNA   . Hypokalemia   . Abnormal CXR   . Pneumonia   . Pre-op evaluation   . Pulmonary hypertension (HCC) 09/20/2015  . Normocytic anemia 09/20/2015  . Sepsis due to undetermined organism (HCC) 09/19/2015  . Intertrochanteric fracture of left hip (HCC) 09/19/2015  . Dementia with behavioral disturbance 09/19/2015  . Acute respiratory failure with hypoxia (HCC) 09/19/2015  . Thrombocytopenia (HCC) 09/19/2015  . Subdural hematoma (HCC) 09/19/2015  . S/P percutaneous endoscopic gastrostomy (PEG) tube placement (HCC) 09/03/2015  . Abnormal transaminases 03/05/2015  . Gastric regurgitation 10/13/2014  . Dysphagia 09/23/2013    Orientation ACTIVITIES/SOCIAL BLADDER RESPIRATION     (Disoriented x4)  Family supportive Incontinent O2 (As needed)  BEHAVIORAL SYMPTOMS/MOOD NEUROLOGICAL BOWEL NUTRITION STATUS   (n/a)   Incontinent Feeding tube  PHYSICIAN VISITS COMMUNICATION OF NEEDS Height & Weight Skin    Does not communicate  (180.3 cm) 164 lbs. Surgical wounds (L hip)          AMBULATORY STATUS RESPIRATION    Assist extensive O2 (As needed)      Personal Care Assistance Level of  Assistance  Bathing, Feeding, Dressing Bathing Assistance: Maximum assistance Feeding assistance: Maximum assistance (Tube feeding) Dressing Assistance: Maximum assistance      Functional Limitations Info                SPECIAL CARE FACTORS FREQUENCY  PT (By licensed PT), OT (By licensed OT)     PT Frequency: 5 OT Frequency: 5           Additional Factors Info  Code Status, Isolation Precautions, Psychotropic Code Status Info: DNR   Psychotropic Info: sertraline (ZOLOFT), LORazepam (ATIVAN)   Isolation Precautions Info: MRSA     Current Medications (09/24/2015): Current Facility-Administered Medications  Medication Dose Route Frequency Provider Last Rate Last Dose  . 0.9 %  sodium chloride infusion   Intravenous Once Elease Etienne, MD      . 0.9 %  sodium chloride infusion   Intravenous Continuous Naiping Donnelly Stager, MD   Stopped at 09/24/15 0401  . acetaminophen (TYLENOL) tablet 650 mg  650 mg Oral Q6H PRN Tarry Kos, MD   650 mg at 09/24/15 1213   Or  . acetaminophen (TYLENOL) suppository 650 mg  650 mg Rectal Q6H PRN Naiping Donnelly Stager, MD      . alum & mag hydroxide-simeth (MAALOX/MYLANTA) 200-200-20 MG/5ML suspension 30 mL  30 mL Oral Q4H PRN Naiping Donnelly Stager, MD      . antiseptic oral rinse (CPC / CETYLPYRIDINIUM CHLORIDE 0.05%) solution 7 mL  7 mL Mouth Rinse q12n4p Elliot Cousin, MD   7 mL at 09/24/15 1214  .  chlorhexidine (PERIDEX) 0.12 % solution 15 mL  15 mL Mouth Rinse BID Elliot Cousin, MD   15 mL at 09/24/15 1012  . feeding supplement (OSMOLITE 1.5 CAL) liquid 320 mL  320 mL Per Tube QID Marinell Blight, RD   320 mL at 09/24/15 0218  . free water 100 mL  100 mL Per Tube 3 times per day Elliot Cousin, MD   100 mL at 09/24/15 1454  . HYDROcodone-acetaminophen (NORCO/VICODIN) 5-325 MG per tablet 1-2 tablet  1-2 tablet Oral Q6H PRN Tarry Kos, MD   1 tablet at 09/23/15 2343  . ipratropium-albuterol (DUONEB) 0.5-2.5 (3) MG/3ML nebulizer solution 3 mL  3 mL  Nebulization Q6H PRN Leroy Sea, MD      . lactated ringers infusion   Intravenous Continuous Achille Rich, MD 50 mL/hr at 09/22/15 1642    . LORazepam (ATIVAN) tablet 0.5 mg  0.5 mg Oral PRN Meredeth Ide, MD   0.5 mg at 09/21/15 0130  . menthol-cetylpyridinium (CEPACOL) lozenge 3 mg  1 lozenge Oral PRN Naiping Donnelly Stager, MD       Or  . phenol (CHLORASEPTIC) mouth spray 1 spray  1 spray Mouth/Throat PRN Naiping Donnelly Stager, MD      . methocarbamol (ROBAXIN) tablet 500 mg  500 mg Oral Q6H PRN Naiping Donnelly Stager, MD       Or  . methocarbamol (ROBAXIN) 500 mg in dextrose 5 % 50 mL IVPB  500 mg Intravenous Q6H PRN Naiping Donnelly Stager, MD      . metoCLOPramide (REGLAN) tablet 5-10 mg  5-10 mg Oral Q8H PRN Naiping Donnelly Stager, MD       Or  . metoCLOPramide (REGLAN) injection 5-10 mg  5-10 mg Intravenous Q8H PRN Naiping Donnelly Stager, MD      . morphine 2 MG/ML injection 0.5 mg  0.5 mg Intravenous Q2H PRN Naiping Donnelly Stager, MD      . ondansetron [facility]) tablet 4 mg  4 mg Oral Q6H PRN Naiping Donnelly Stager, MD       Or  . ondansetron [facility]) injection 4 mg  4 mg Intravenous Q6H PRN Naiping Donnelly Stager, MD      . pantoprazole sodium (PROTONIX) 40 mg/20 mL oral suspension 40 mg  40 mg Oral BID AC Meredeth Ide, MD   40 mg at 09/24/15 1012  . sertraline (ZOLOFT) tablet 25 mg  25 mg Oral Daily Meredeth Ide, MD   25 mg at 09/24/15 1013  . sodium chloride 0.9 % injection 10-40 mL  10-40 mL Intracatheter Q12H Elliot Cousin, MD   10 mL at 09/23/15 0955  . sodium chloride 0.9 % injection 10-40 mL  10-40 mL Intracatheter PRN Elliot Cousin, MD   10 mL at 09/24/15 0503  . sodium chloride 0.9 % injection 10-40 mL  10-40 mL Intracatheter PRN Kathryne Hitch, MD      . TWOCAL HN liquid 237 mL  237 mL Per Tube Q6H Starleen Arms, MD   237 mL at 09/24/15 1456  . valproic acid (DEPAKENE) 250 MG/5ML syrup 125 mg  125 mg Oral BID Meredeth Ide, MD   125 mg at 09/24/15 1013   Do not use this list as official medication orders. Please verify with discharge  summary.  Discharge Medications:   Medication List    TAKE these medications        aspirin EC 325 MG tablet  Take 1 tablet (325 mg total) by mouth  daily.     HYDROcodone-acetaminophen 5-325 MG tablet  Commonly known as:  NORCO  Take 1-2 tablets by mouth every 6 (six) hours as needed.      ASK your doctor about these medications        ipratropium-albuterol 0.5-2.5 (3) MG/3ML Soln  Commonly known as:  DUONEB  Take 3 mLs by nebulization every 6 (six) hours as needed (FOR SHORTNESS OF BREATH/WHEEZING).     LORazepam 0.5 MG tablet  Commonly known as:  ATIVAN  Take 0.5 mg by mouth as needed for anxiety.     NEXIUM 40 MG packet  Generic drug:  esomeprazole  40 mg by Gastric Tube route 2 (two) times daily.     sertraline 25 MG tablet  Commonly known as:  ZOLOFT  25 mg by Gastric Tube route daily.     sterile water for irrigation 237 mL  Give by tube 3 (three) times daily.     valproic acid 250 MG/5ML syrup  Commonly known as:  DEPAKENE  125 mg by Gastric Tube route 2 (two) times daily.     Vitamin D 2000 UNITS tablet  Place 2,000 Units into feeding tube daily.        Relevant Imaging Results:  Relevant Lab Results:  Recent Labs    Additional Information    Rojelio BrennerVaughn, Casidee Jann S, LCSW [phone]

## 2015-09-24 NOTE — Progress Notes (Signed)
Pt unavailable for CPT

## 2015-09-24 NOTE — Progress Notes (Signed)
Linus OrnMary Shaw, [place], came by today and stated that she wanted the patient to return to [facility] in [place], [place].  I told her I would pass this information to the SW tomorrow.  Patient was having loose stools after receiving his Osmolite 1.5, I discuss this with Dr. Randol KernElgergawy and called to see what type of feeds patient was receiving prior to admission.  He was changed to HN2 Cal.

## 2015-09-24 NOTE — Clinical Documentation Improvement (Signed)
Internal Medicine  Abnormal Lab/Test Results:  Urine culture with >100,000 colonies of enterococcus   Would you please help clarify  the specific medical condition related to the clinical findings?  UTI due to enterococcus Unable to clinically determine at this time. Other clinical explanation.   Supporting Information: Urine culture growing >100,000 colonies of enterococcus.   Dr. Randol KernElgergawy states a shunt was treated with Zosyn, we'll give fosfomycin  Treatment Provided: See above   Please exercise your independent, professional judgment when responding. A specific answer is not anticipated or expected.   Thank You,  Harrie Jeanseborah L Sidnie Swalley, RN, BSN, MBA, [facility] Health Information Management [place] [phone]

## 2015-09-25 LAB — TYPE AND SCREEN
ABO/RH(D): A POS
Antibody Screen: NEGATIVE
UNIT DIVISION: 0
Unit division: 0
Unit division: 0

## 2015-09-25 LAB — GLUCOSE, CAPILLARY
GLUCOSE-CAPILLARY: 114 mg/dL — AB (ref 65–99)
GLUCOSE-CAPILLARY: 91 mg/dL (ref 65–99)
GLUCOSE-CAPILLARY: 94 mg/dL (ref 65–99)
Glucose-Capillary: 108 mg/dL — ABNORMAL HIGH (ref 65–99)

## 2015-09-25 LAB — HEMOGLOBIN AND HEMATOCRIT, BLOOD
HEMATOCRIT: 29.1 % — AB (ref 39.0–52.0)
HEMATOCRIT: 29 % — AB (ref 39.0–52.0)
HEMOGLOBIN: 9.6 g/dL — AB (ref 13.0–17.0)
Hemoglobin: 9.5 g/dL — ABNORMAL LOW (ref 13.0–17.0)

## 2015-09-25 NOTE — Progress Notes (Signed)
Nutrition Follow-up  DOCUMENTATION CODES:   Not applicable  INTERVENTION:  Continue TwoCal HN formula at 237 ml (1 can) 4 times daily via PEG which provides 1896 kcal, 79 grams of protein, and 664 ml of free water.   Continue free water flushes. Once IV fluids are discontinued, increase free water flushes to 200 ml 5 times daily between boluses.  RD to continue to monitor.   NUTRITION DIAGNOSIS:   Inadequate oral intake related to inability to eat as evidenced by NPO status; ongoing  GOAL:   Patient will meet greater than or equal to 90% of their needs; met  MONITOR:   Labs, I & O's, Weight trends, TF tolerance  REASON FOR ASSESSMENT:   Consult Enteral/tube feeding initiation and management  ASSESSMENT:   81 y.o. Male PMHx dementia, aspiration pneumonia -PEG dependant, prior stroke, HTN, CKD, DVT and PE who lives at [facility] nursing home. Pt presents after falling at nursing home and suffering a left hip fx.  Pt now with sepsis secondary to PNA.   Pt has been having loose stools when receiving the Osmolite 1.5 formula. Pt has been changed to TwoCal HN formula, which is what formula he was on prior to current admission. Pt is currently receiving TwoCal HN 237 ml (1 can) QID which is providing 1896 kcal (99% of needs), 79 grams of protein and 664 ml of free water. RD to continue to monitor.   Diet Order:   NPO  Skin:   (L hip incision, +1 generalized edema)  Last BM:  10/27  Height:   Ht Readings from Last 1 Encounters:  09/20/15 5' 11" (1.803 m)    Weight:   Wt Readings from Last 1 Encounters:  09/25/15 165 lb 9.1 oz (75.1 kg)  Admit weight 150 lbs (68 kg)  Ideal Body Weight:  78.18 kg  BMI:  Body mass index is 23.1 kg/(m^2).  Estimated Nutritional Needs:   Kcal:  1900-2050  Protein:  80-100 grams  Fluid:  1.9 - 2.1 L/day  EDUCATION NEEDS:   No education needs identified at this time   , MS, RD, LDN Pager # [phone] After hours/  weekend pager # [phone]  

## 2015-09-25 NOTE — Progress Notes (Signed)
OT Cancellation Note  Patient Details Name: Juan Owens Severe MRN: [id number] DOB: 1931-07-24   Cancelled Treatment:    Reason Eval/Treat Not Completed: Other (comment) Pt is Medicare and current D/C plan is SNF. No apparent immediate acute care OT needs, therefore will defer OT to SNF. If OT eval is needed please call Acute Rehab Dept. at [phone] or text page OT at [phone].  Katherine Shaw [facility] Hospital[facility],HILLARY  Len Azeez, OTR/L  [phone] 09/25/2015 09/25/2015, 9:50 AM

## 2015-09-25 NOTE — Progress Notes (Addendum)
Physical Therapy Treatment Patient Details Name: Juan Owens MRN: [id number] DOB: Mar 10, 1931 Today's Date: 09/25/2015    History of Present Illness Juan Owens is a 81 y.o. male who presents with left hip fx s/p mechanical fall yesterday. Has had chronic SDH from fall in the past that has been stable. Had IVC filter placed for LE DVT. Patient is ambulatory at baseline with walker and assistance. Patient is nonverbal secondary to severe dementia therefore ROS and other details of HPI are limited; now s/p surgery for Treatment of intertrochanteric, pertrochanteric, subtrochanteric fracture with intramedullary implant    PT Comments    Patient with limited progress this session due to soiled in bed and having to assist with cleaning.  Noted more movement achieved in left LE with rolling for hygiene than with PROM efforts due to pain.  Still agree with SNF plans for progressing mobility as able.   Follow Up Recommendations  SNF     Equipment Recommendations  None recommended by PT    Recommendations for Other Services       Precautions / Restrictions Precautions Precautions: Fall Precaution Comments: PEG Restrictions LLE Weight Bearing: Weight bearing as tolerated    Mobility  Bed Mobility Overal bed mobility: +2 for physical assistance;Needs Assistance Bed Mobility: Rolling Rolling: +2 for physical assistance         General bed mobility comments: rolling in bed for cleaning due to incontinent of stool; limited to in bed mobility due to pt unable to assist with mobility and resistant due to pain; at times resisting mobility due to pain, but able to settle briefly in right sidelying with hand on rail to support himself on the side  Transfers                    Ambulation/Gait                 Stairs            Wheelchair Mobility    Modified Rankin (Stroke Patients Only)       Balance                                     Cognition Arousal/Alertness: Lethargic (opens eyes briefly with mobilization in bed and with PROM)   Overall Cognitive Status: No family/caregiver present to determine baseline cognitive functioning                      Exercises General Exercises - Upper Extremity Shoulder Flexion: PROM;Right;5 reps;Supine Elbow Flexion: PROM;Right;5 reps;Supine Elbow Extension: PROM;Right;5 reps;Supine Wrist Flexion: PROM;Right;5 reps;Supine Wrist Extension: PROM;Supine;Right;5 reps Composite Extension: PROM;Right;5 reps;Supine General Exercises - Lower Extremity Ankle Circles/Pumps: PROM;Both;10 reps;Supine Heel Slides: PROM;Both;5 reps;Supine    General Comments        Pertinent Vitals/Pain Faces Pain Scale: Hurts whole lot Pain Location: L LE with PROM Pain Descriptors / Indicators: Grimacing Pain Intervention(s): Limited activity within patient's tolerance;Monitored during session;Repositioned    Home Living                      Prior Function            PT Goals (current goals can now be found in the care plan section) Progress towards PT goals: Not progressing toward goals - comment (due to pain, stool incontinence, advanced dementia)    Frequency  Min 2X/week  PT Plan Current plan remains appropriate    Co-evaluation             End of Session   Activity Tolerance: Patient limited by pain;Patient limited by lethargy Patient left: in bed (with HOB at 30 degrees and heels floated)     Time: [id number] PT Time Calculation (min) (ACUTE ONLY): 32 min  Charges:  $Therapeutic Exercise: 8-22 mins $Therapeutic Activity: 8-22 mins                    G Codes:      Callum Wolf,CYNDI 09/25/2015, 12:01 PM Sheran Lawlessyndi Paisli Silfies, PT [phone] 09/25/2015

## 2015-09-25 NOTE — Clinical Social Work Note (Signed)
CSW was notified of possible discharge for Saturday.  [facility] is aware and agreeable to Saturday discharge.  Vickii PennaGina Milus Fritze, LCSW [phone]  [facility] Licensed Clinical Social Worker

## 2015-09-25 NOTE — Progress Notes (Signed)
Pt is combative this morning as RT is attempting chest PT. RN aware and to clean up pt and give ativan, will try again at a later time.

## 2015-09-25 NOTE — Progress Notes (Addendum)
PROGRESS NOTE    Juan Owens ZOX:[id number] DOB: [birth year] DOA: 09/18/2015 PCP: Pearson Grippe, MD  HPI/Brief narrative 81 year old male patient with history of advanced dementia, DVT & PE status post IVC filter, stroke, HTN, dysphagia status post PEG tube, aspiration pneumonia, chronic subdural hematoma, former smoker, presented to [facility] on 09/19/15 after a fall at SNF and resultant left hip fracture. Due to multiple significant comorbidities, he was felt to to be high risk and hence was transferred to [facility] for further evaluation and management. Did not use oxygen prior to admission. Noted to be hypoxic with oxygen saturations in the 88-90 percent on 6 L/m oxygen via nasal cannula. CCM consulted and surgery was postponed by a day for stabilization. Patient is high risk for surgical fixation, family accepting risk, underwent hip surgical repair on 10/25.  Assessment/Plan:  Acute hypoxic respiratory failure - Secondary to aspiration versus healthcare associated pneumonia versus atelectasis. - treated with  broad-spectrum IV antibiotics i.e. vancomycin and Zosyn for total of 5 days, supplemental oxygen, aggressive pulmonary toilet as able-patient most likely will not cooperate for incentive spirometry or chest vest - PCCM follow up appreciated - Improving.    Aspiration pneumonia versus HCAP - Management as above - Blood cultures 2: Negative to date.   Sepsis - Likely secondary to pneumonia. Present on admission but since then has resolved. - Urine culture growing enterococcus species,patient  was treated with Zosyn, we'll give fosfomycin .  UTI secondary to enterococcus species - Treated, initially on Zosyn, treated with fosfomycin.  Left hip fracture - Overall patient is at high risk for perioperative complications from his current tenuous respiratory status. Patient ambulated prior to his fall and hip fracture and hence hip fracture surgery will be palliative for pain  control and mobilization.  - Transfused 1 unit PRBC prior to surgery. - Status post surgical repair 10/25 by Dr. Roda Shutters - Patient with intramuscular hematoma secondary to fracture, "2 units PRBC transfusion 10/27.  Chronic subdural hematoma - Prior hospitalist colleagues have discussed with neurosurgery and no intervention recommended.  Chronic dysphagia - On PEG tube feeding.   Anemia of acute blood loss - hemoglobin has progressively dropped from 12.8 on admission to 7.9 on 10/25, transfused 1 unit PRBC prior to surgery. Hemoglobin improved to 9.2 after transfusion. Dropped to 6.3 after surgery, CT left hip showing evidence of intramuscular hematoma secondary to fracture, transfused 2 units PRBC on 10/27 with good response, hemoglobin is 9.6 today, will check hemoglobin in a.m. and transfuse as needed. - We'll hold aspirin  Thrombocytopenia - Unclear etiology. Stable, continue to follow.  Advanced dementia with behavioral disturbances and depression - Mental status possibly at baseline.  Right upper lobe pulmonary nodule, seen on CT - No further follow-up since patient will not be a candidate for any aggressive treatment  Hypokalemia - Repleted,   - Will discontinue Foley catheter today.   DVT prophylaxis: SCDs Code Status: DO NOT RESUSCITATE Family Communication: Cost with daughter over the phone Disposition Plan: Discharge to SNF in a.m. of hemoglobin remained stable   Consultants:  Orthopedics  PCCM  Procedures:  Right upper extremity PICC, 09/19/15  2-D echo 10/22: Study Conclusions - Left ventricle: The cavity size was normal. Wall thickness was normal. Systolic function was normal. The estimated ejection fraction was in the range of 55% to 60%. Wall motion was normal; there were no regional wall motion abnormalities. Doppler parameters are consistent with abnormal left ventricular relaxation (grade 1 diastolic dysfunction). - Right ventricle:  The  cavity size was normal. Wall thickness was increased. - Right atrium: The atrium was mildly dilated. - Pulmonary arteries: Systolic pressure was moderately increased. PA peak pressure: 55 mm Hg (S).  Foley catheter  Antibiotics:  Vancomycin 10/22>> 10/26  Zosyn 10/22 >> 10/27  Fosfomycin once 10/26  Subjective: Patient mostly nonverbal this morning. As per nursing, no acute issues.   Objective: Filed Vitals:   09/24/15 1735 09/24/15 1930 09/24/15 2232 09/25/15 0455  BP: 101/53 110/67 111/55 106/61  Pulse: 81 83 81 75  Temp: 98.2 F (36.8 C) 98.5 F (36.9 C) 98.3 F (36.8 C) 98 F (36.7 C)  TempSrc: Axillary Axillary Axillary Axillary  Resp: Height:      Weight:    75.1 kg (165 lb 9.1 oz)  SpO2: 100% 100% 100% 100%    Intake/Output Summary (Last 24 hours) at 09/25/15 1134 Last data filed at 09/25/15 0700  Gross per 24 hour  Intake 1476.58 ml  Output   1400 ml  Net  76.58 ml   Filed Weights   09/23/15 0500 09/24/15 0728 09/25/15 0455  Weight: 75.6 kg (166 lb 10.7 oz) 74.4 kg (164 lb 0.4 oz) 75.1 kg (165 lb 9.1 oz)     Exam:  General exam: Elderly frail male lying comfortably propped up in bed Respiratory system: Reduced breath sounds in the bases with few basal crackles. Rest of lung fields clear to auscultation. No increased work of breathing. Cardiovascular system: S1 & S2 heard, RRR. No JVD, murmurs, gallops, clicks or pedal edema.  Gastrointestinal system: Abdomen is nondistended, soft and nontender. Normal bowel sounds heard. Central nervous system: Alert but mostly non verbal and does not follow instructions. No focal neurological deficits. Extremities: Moves all limbs symmetrically except left lower extremity with movements limited by pain.   Data Reviewed: Basic Metabolic Panel:  Recent Labs Lab 09/20/15 0428 09/20/15 1015 09/22/15 0530 09/22/15 1035 09/23/15 0305 09/24/15 0504  NA 142 145 146*  --  141 140  K 3.6 3.6 2.8*   --  3.1* 3.0*  CL 113* 115* 112*  --  106 108  CO2 --  25 28  GLUCOSE 109* 107* 90  --  151* 135*  BUN --  13 12  CREATININE 0.70 0.64 0.80  --  0.68 0.82  CALCIUM 8.4* 8.3* 8.5*  --  8.5* 7.6*  MG  --   --   --  1.7  --   --    Liver Function Tests:  Recent Labs Lab 09/18/15 2325 09/20/15 0428  AST 30 25  ALT 22 14*  ALKPHOS 113 73  BILITOT 0.9 1.1  PROT 7.2 5.6*  ALBUMIN 4.0 2.9*   No results for input(s): LIPASE, AMYLASE in the last 168 hours. No results for input(s): AMMONIA in the last 168 hours. CBC:  Recent Labs Lab 09/18/15 2325 09/19/15 0429 09/20/15 0428 09/22/15 0530 09/23/15 0305 09/24/15 0504 09/25/15 0750  WBC 13.3* 9.0 7.3 9.0 10.2 9.1  --   NEUTROABS 11.8*  --   --   --   --   --   --   HGB 12.8* 11.3* 9.5* 7.9* 9.2* 6.3* 9.6*  HCT 39.3 35.4* 29.4* 24.6* 28.9* 19.7* 29.1*  MCV 98.5 99.2 99.7 98.0 96.3 95.2  --   PLT 156 141* 112* 109* 122* 151  --    Cardiac Enzymes: No results for input(s): CKTOTAL, CKMB, CKMBINDEX, TROPONINI in the  last 168 hours. BNP (last 3 results) No results for input(s): PROBNP in the last 8760 hours. CBG:  Recent Labs Lab 09/23/15 2342 09/24/15 0405 09/24/15 0801 09/24/15 1142 09/25/15 0453  GLUCAP 123* 121* 95 94 108*    Recent Results (from the past 240 hour(s))  Blood culture (routine x 2)     Status: None   Collection Time: 09/19/15 12:09 AM  Result Value Ref Range Status   Specimen Description BLOOD LEFT ARM  Final   Special Requests BOTTLES DRAWN AEROBIC AND ANAEROBIC 8CC  Final   Culture NO GROWTH 5 DAYS  Final   Report Status 09/24/2015 FINAL  Final  Blood culture (routine x 2)     Status: None   Collection Time: 09/19/15 12:13 AM  Result Value Ref Range Status   Specimen Description BLOOD LEFT HAND  Final   Special Requests BOTTLES DRAWN AEROBIC AND ANAEROBIC 8CC  Final   Culture NO GROWTH 5 DAYS  Final   Report Status 09/24/2015 FINAL  Final  Urine culture     Status: None    Collection Time: 09/19/15 12:58 AM  Result Value Ref Range Status   Specimen Description URINE, CATHETERIZED  Final   Special Requests NONE  Final   Culture   Final    >=100,000 COLONIES/mL ENTEROCOCCUS SPECIES UNABLE TO OBTAIN SENSITIVITY PATTERN [facility]    Report Status 09/23/2015 FINAL  Final  MRSA PCR Screening     Status: Abnormal   Collection Time: 09/19/15  1:51 AM  Result Value Ref Range Status   MRSA by PCR POSITIVE (A) NEGATIVE Final    Comment:        The GeneXpert MRSA Assay (FDA approved for NASAL specimens only), is one component of a comprehensive MRSA colonization surveillance program. It is not intended to diagnose MRSA infection nor to guide or monitor treatment for MRSA infections. RESULT CALLED TO, READ BACK BY AND VERIFIED WITH:  HAMMOCK,S @ 0455 ON 09/19/15 BY WOODIE,J            Studies: Ct Hip Left Wo Contrast  09/24/2015  CLINICAL DATA:  Left hip fracture.  Anemia. EXAM: CT OF THE LEFT HIP WITHOUT CONTRAST TECHNIQUE: Multidetector CT imaging of the left hip was performed according to the standard protocol. Multiplanar CT image reconstructions were also generated. COMPARISON:  None. FINDINGS: There is a comminuted left intertrochanteric fracture transfixed with an intra medullary nail and 2 cannulated interlocking femoral neck screws. There is 7 mm of medial displacement of the femoral shaft relative to the proximal neck of the femur. The lesser trochanteric fracture fragment is displaced medially and superiorly. There is severe osteoarthritis with severe axial joint space narrowing of the left hip with associated subchondral cystic changes and marginal osteophytosis. There is expansion of the left iliopsoas muscle compared with the contralateral side likely reflecting an intramuscular hematoma. There are postsurgical changes and soft tissue stranding within the subcutaneous fat around the left hip and left distal femur. There  is a small hematoma in the left gluteus medius muscle measuring 2.3 cm. There is a fat containing left inguinal hernia. There is no inguinal lymphadenopathy. There is mild peripheral vascular atherosclerotic disease. IMPRESSION: 1. Interval ORIF of a left intertrochanteric fracture. No hardware failure or complication. 7 mm of medial displacement of the femoral shaft relative to the femoral neck. Lesser trochanteric fragment is displaced medially and superiorly. 2. Expansion of the left iliopsoas muscle most concerning for an intramuscular hematoma. Small intramuscular  hematoma in the left gluteus medius muscle measuring 2.3 cm. 3. Severe osteoarthritis of the left hip. Electronically Signed   By: Elige Ko   On: 09/24/2015 16:56        Scheduled Meds: . sodium chloride   Intravenous Once  . antiseptic oral rinse  7 mL Mouth Rinse q12n4p  . chlorhexidine  15 mL Mouth Rinse BID  . free water  100 mL Per Tube 3 times per day  . pantoprazole sodium  40 mg Oral BID AC  . sertraline  25 mg Oral Daily  . sodium chloride  10-40 mL Intracatheter Q12H  . TWOCAL HN  237 mL Per Tube Q6H  . valproic acid  125 mg Oral BID   Continuous Infusions: . sodium chloride 125 mL/hr at 09/25/15 0229  . lactated ringers 50 mL/hr at 09/22/15 1642    Principal Problem:   Sepsis due to undetermined organism [facility]) Active Problems:   S/P percutaneous endoscopic gastrostomy (PEG) tube placement [facility])   Intertrochanteric fracture of left hip (HCC)   Dementia with behavioral disturbance   Acute respiratory failure with hypoxia (HCC)   Thrombocytopenia (HCC)   Subdural hematoma (HCC)   Pulmonary hypertension (HCC)   Normocytic anemia   Abnormal CXR   Pneumonia   Pre-op evaluation   Hospital acquired PNA   Hypokalemia   HCAP (healthcare-associated pneumonia)    Time spent: 25 minutes    ELGERGAWY, DAWOOD, MD,  [facility] Pager [phone]  If 7PM-7AM, please contact  night-coverage [email or web address] Password [email or web address] 09/25/2015, 11:34 AM    LOS: 6 days

## 2015-09-26 LAB — GLUCOSE, CAPILLARY
GLUCOSE-CAPILLARY: 101 mg/dL — AB (ref 65–99)
GLUCOSE-CAPILLARY: 111 mg/dL — AB (ref 65–99)
GLUCOSE-CAPILLARY: 93 mg/dL (ref 65–99)

## 2015-09-26 MED ORDER — TWOCAL HN PO LIQD: 237.0000 mL | Freq: Four times a day (QID) | ORAL | Status: AC

## 2015-09-26 MED ORDER — ONDANSETRON HCL 4 MG PO TABS: 4.0000 mg | ORAL_TABLET | Freq: Four times a day (QID) | ORAL | Status: AC | PRN

## 2015-09-26 MED ORDER — HYDROCODONE-ACETAMINOPHEN 5-325 MG PO TABS: 1.0000 | ORAL_TABLET | Freq: Four times a day (QID) | ORAL | Status: AC | PRN

## 2015-09-26 MED ORDER — LORAZEPAM 0.5 MG PO TABS: 0.5000 mg | ORAL_TABLET | ORAL | Status: AC | PRN

## 2015-09-26 MED ORDER — ASPIRIN EC 325 MG PO TBEC: 325.0000 mg | DELAYED_RELEASE_TABLET | Freq: Every day | ORAL | Status: AC

## 2015-09-26 MED ORDER — ASPIRIN EC 325 MG PO TBEC: 325.0000 mg | DELAYED_RELEASE_TABLET | Freq: Every day | ORAL | Status: DC

## 2015-09-26 MED ORDER — ACETAMINOPHEN 325 MG PO TABS: 650.0000 mg | ORAL_TABLET | Freq: Four times a day (QID) | ORAL | Status: AC | PRN

## 2015-09-26 NOTE — Clinical Social Work Note (Signed)
Patient to be d/c'ed today to [facility] in [place].  Patient and family agreeable to plans will transport via ems RN to call report.  CSW notified patient's daughter Linus OrnMary Shaw at [phone].  Windell MouldingEric Debra Calabretta, MSW, Theresia MajorsLCSWA [phone]

## 2015-09-26 NOTE — Progress Notes (Signed)
CT scan reviewed - shows post surgical changes. Hgb is stable. Stable from ortho standpoint for dc to SNF F/u 2 weeks with me.  Mayra ReelN. Michael Lakota Schweppe, MD [facility] [phone] 9:59 AM

## 2015-09-26 NOTE — Discharge Summary (Signed)
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 NEGATIVE Final    Comment:        The GeneXpert MRSA Assay (FDA approved for NASAL specimens only), is one component of a comprehensive MRSA colonization surveillance program. It is not intended to diagnose MRSA infection nor to guide or monitor treatment for MRSA infections. RESULT CALLED TO, READ BACK BY AND VERIFIED WITH:  HAMMOCK,S @ 0455 ON 09/19/15 BY Anner Crete        Today   Subjective:   Juan Owens today nonverbal this morning. Which is his baseline ,As per nursing, no acute issues.    Objective:   Blood pressure 135/70, pulse 71, temperature 98 F (36.7 C), temperature source Axillary, resp. rate 19, height  (1.803 m), weight 75.6 kg (166 lb 10.7 oz), SpO2 94 %.   Intake/Output Summary (Last 24 hours) at 09/26/15 1113 Last data filed at 09/26/15 0500  Gross per 24 hour  Intake      0 ml  Output   1200 ml  Net  -1200 ml    Exam General exam: Elderly frail  male lying comfortably propped up in bed Respiratory system: Reduced  breath sounds in the bases with few basal crackles. Rest of lung fields clear to auscultation. No increased work of breathing. Cardiovascular system: S1 & S2 heard, RRR. No JVD, murmurs, gallops, clicks or pedal edema.  Gastrointestinal system: Abdomen is nondistended, soft and nontender. Normal bowel sounds heard. Central nervous system: Alert but mostly non verbal and does not follow instructions. No focal neurological deficits. Extremities: Moves all limbs symmetrically except left lower extremity with movements limited by pain.  Data Review   CBC w Diff:  Lab Results  Component Value Date   WBC 9.1 09/24/2015   HGB 9.5* 09/25/2015   HCT 29.0* 09/25/2015   PLT 151 09/24/2015   LYMPHOPCT 3 09/18/2015   MONOPCT 8 09/18/2015   EOSPCT 0 09/18/2015   BASOPCT 0 09/18/2015    CMP:  Lab Results  Component Value Date   NA 140 09/24/2015   K 3.0* 09/24/2015   CL 108 09/24/2015   CO2 28 09/24/2015   BUN 12 09/24/2015   CREATININE 0.82 09/24/2015   PROT 5.6* 09/20/2015   ALBUMIN 2.9* 09/20/2015   BILITOT 1.1 09/20/2015   ALKPHOS 73 09/20/2015   AST 25 09/20/2015   ALT 14* 09/20/2015  .   Total Time in preparing paper work, data evaluation and todays exam - 35 minutes  Juan Owens M.D on 09/26/2015 at 11:13 AM  [facility]   Office  [phone]

## 2015-09-29 ENCOUNTER — Other Ambulatory Visit (HOSPITAL_COMMUNITY): Payer: Medicare Other

## 2015-09-29 ENCOUNTER — Ambulatory Visit (HOSPITAL_COMMUNITY): Payer: Medicare Other | Admitting: Speech Pathology

## 2015-09-30 ENCOUNTER — Encounter (HOSPITAL_COMMUNITY)
Admission: RE | Disposition: A | Discharge status: Skilled Nursing Facility | Payer: Self-pay | Source: Ambulatory Visit | Attending: Gastroenterology

## 2015-09-30 ENCOUNTER — Ambulatory Visit (HOSPITAL_COMMUNITY)
Admission: RE | Admit: 2015-09-30 | Discharge: 2015-09-30 | Disposition: A | Discharge status: Skilled Nursing Facility | Payer: Medicare Other | Source: Ambulatory Visit | Attending: Gastroenterology | Admitting: Gastroenterology

## 2015-09-30 ENCOUNTER — Telehealth: Payer: Self-pay | Admitting: Gastroenterology

## 2015-09-30 DIAGNOSIS — Z931 Gastrostomy status: Secondary | ICD-10-CM | POA: Diagnosis not present

## 2015-09-30 DIAGNOSIS — R1310 Dysphagia, unspecified: Secondary | ICD-10-CM | POA: Insufficient documentation

## 2015-09-30 HISTORY — PX: PEG PLACEMENT: SHX5437

## 2015-09-30 SURGERY — REPLACEMENT, PEG TUBE, WITHOUT ENDOSCOPY
Anesthesia: LOCAL

## 2015-09-30 NOTE — Discharge Instructions (Signed)
Resume tube feeds °

## 2015-09-30 NOTE — Telephone Encounter (Signed)
PEG FELL OUT. NEEDS TO BE REPLACED. BEDSIDE PEG PLACEMENT TODAY.

## 2015-10-01 NOTE — Procedures (Signed)
  PROCEDURE TECHNIQUE:  PT PREPPED AND DRAPED. 20 Fr/30 cc FOLEY REMOVED. BALLOON ASPIRATED. REPLACED WITH 20 Fr BALLOON PEG. 6 CC STERILE WATER IN BALLOON. SKIN AT 3 CM. TOP OF BUMPER AT 5.5 CM. ALL PORTS FLUSHED AND ASPIRATED.  PLAN: 1. TUBE FEEDS/MEDS TODAY. 2. OPV PRN.

## 2015-10-05 ENCOUNTER — Encounter (HOSPITAL_COMMUNITY): Payer: Self-pay | Admitting: Gastroenterology

## 2015-10-05 ENCOUNTER — Ambulatory Visit (HOSPITAL_COMMUNITY): Payer: Medicare Other | Admitting: Speech Pathology

## 2015-10-05 ENCOUNTER — Other Ambulatory Visit (HOSPITAL_COMMUNITY): Payer: Medicare Other

## 2016-01-25 ENCOUNTER — Other Ambulatory Visit: Payer: Self-pay

## 2016-01-25 ENCOUNTER — Telehealth: Payer: Self-pay

## 2016-01-25 DIAGNOSIS — Z431 Encounter for attention to gastrostomy: Secondary | ICD-10-CM

## 2016-01-25 NOTE — Telephone Encounter (Signed)
Talked with April at [facility]. She is aware that he need to be at Short Stay in the morning at 11:30am. Nothing to eat or drink after midnight.

## 2016-01-25 NOTE — Telephone Encounter (Signed)
Noted. Discussing with Dr. Jena Gauss.

## 2016-01-25 NOTE — Telephone Encounter (Signed)
Arrange for PEG tube replacement tomorrow, 2/28, with Dr. Darrick Penna.

## 2016-01-25 NOTE — Telephone Encounter (Signed)
Pt's PEG tube came out Sunday and they have a foley in place to keep the hole open. The nursing home is calling to see when we could put a PEG back in for him. Please advise

## 2016-01-26 ENCOUNTER — Ambulatory Visit (HOSPITAL_COMMUNITY)
Admission: RE | Admit: 2016-01-26 | Discharge: 2016-01-26 | Disposition: A | Discharge status: Home / Self Care | Payer: Medicare Other | Source: Ambulatory Visit | Attending: Gastroenterology | Admitting: Gastroenterology

## 2016-01-26 ENCOUNTER — Encounter (HOSPITAL_COMMUNITY)
Admission: RE | Disposition: A | Discharge status: Home / Self Care | Payer: Self-pay | Source: Ambulatory Visit | Attending: Gastroenterology

## 2016-01-26 DIAGNOSIS — Z431 Encounter for attention to gastrostomy: Secondary | ICD-10-CM | POA: Diagnosis not present

## 2016-01-26 DIAGNOSIS — R1310 Dysphagia, unspecified: Secondary | ICD-10-CM | POA: Diagnosis not present

## 2016-01-26 DIAGNOSIS — Z7982 Long term (current) use of aspirin: Secondary | ICD-10-CM | POA: Diagnosis not present

## 2016-01-26 DIAGNOSIS — Z86711 Personal history of pulmonary embolism: Secondary | ICD-10-CM | POA: Insufficient documentation

## 2016-01-26 DIAGNOSIS — I10 Essential (primary) hypertension: Secondary | ICD-10-CM | POA: Insufficient documentation

## 2016-01-26 DIAGNOSIS — Z79899 Other long term (current) drug therapy: Secondary | ICD-10-CM | POA: Insufficient documentation

## 2016-01-26 DIAGNOSIS — Z86718 Personal history of other venous thrombosis and embolism: Secondary | ICD-10-CM | POA: Insufficient documentation

## 2016-01-26 HISTORY — PX: PEG PLACEMENT: SHX5437

## 2016-01-26 SURGERY — REPLACEMENT, PEG TUBE, WITHOUT ENDOSCOPY
Anesthesia: LOCAL

## 2016-01-26 NOTE — H&P (Signed)
Primary Care Physician:  Pearson Grippe, MD Primary Gastroenterologist:  Dr. Darrick Penna  Pre-Procedure History & Physical: HPI:  Juan Owens is a 82 y.o. male here because Peg fell out. Foley in place.  Past Medical History  Diagnosis Date  . Dementia   . Aspiration pneumonia (HCC)   . DVT (deep venous thrombosis) (HCC)   . Stroke (HCC)   . Hypertension   . Neuropathy (HCC)   . Contracture of hand joint   . Dysphagia   . Renal disorder   . Subdural hemorrhage (HCC)   . PE (pulmonary embolism)     Past Surgical History  Procedure Laterality Date  . Ivc filter    . Peg placement  April 2014    Dr. Michaell Cowing at [facility]  . Peg placement N/A 07/02/2015    Procedure: PERCUTANEOUS ENDOSCOPIC GASTROSTOMY (PEG) REPLACEMENT;  Surgeon: West Bali, MD;  Location: [facility];  Service: Endoscopy;  Laterality: N/A;  . Intramedullary (im) nail intertrochanteric Left 09/22/2015    Procedure: INTRAMEDULLARY (IM) NAIL INTERTROCHANTRIC/ HIP SCREW;  Surgeon: Tarry Kos, MD;  Location: [facility] OR;  Service: Orthopedics;  Laterality: Left;  . Peg placement N/A 09/30/2015    Procedure: PERCUTANEOUS ENDOSCOPIC GASTROSTOMY (PEG) REPLACEMENT;  Surgeon: West Bali, MD;  Location: [facility] SUITE;  Service: Endoscopy;  Laterality: N/A;    Prior to Admission medications   Medication Sig Start Date End Date Taking? Authorizing Provider  acetaminophen (TYLENOL) 325 MG tablet Place 2 tablets (650 mg total) into feeding tube every 6 (six) hours as needed for mild pain (or Fever >/= 101). 09/26/15   Starleen Arms, MD  aspirin EC 325 MG tablet Take 1 tablet (325 mg total) by mouth daily. Continue with aspirin total of 6 weeks for DVT prophylaxis postoperative. 09/26/15   Leana Roe Elgergawy, MD  Cholecalciferol (VITAMIN D) 2000 UNITS tablet Place 2,000 Units into feeding tube daily.    Historical Provider, MD  esomeprazole (NEXIUM) 40 MG packet 40 mg by Gastric Tube route 2 (two) times daily.    Historical Provider,  MD  HYDROcodone-acetaminophen (NORCO) 5-325 MG tablet Place 1-2 tablets into feeding tube every 6 (six) hours as needed. 09/26/15   Leana Roe Elgergawy, MD  ipratropium-albuterol (DUONEB) 0.5-2.5 (3) MG/3ML SOLN Take 3 mLs by nebulization every 6 (six) hours as needed (FOR SHORTNESS OF BREATH/WHEEZING).     Historical Provider, MD  LORazepam (ATIVAN) 0.5 MG tablet Place 1 tablet (0.5 mg total) into feeding tube as needed for anxiety. 09/26/15   Leana Roe Elgergawy, MD  Nutritional Supplements (TWOCAL HN) LIQD Place 237 mLs into feeding tube every 6 (six) hours. 09/26/15   Leana Roe Elgergawy, MD  ondansetron (ZOFRAN) 4 MG tablet Place 1 tablet (4 mg total) into feeding tube every 6 (six) hours as needed for nausea. 09/26/15   Leana Roe Elgergawy, MD  sertraline (ZOLOFT) 25 MG tablet 25 mg by Gastric Tube route daily.     Historical Provider, MD  sterile water for irrigation 237 mL Give by tube 3 (three) times daily.    Historical Provider, MD  valproate (DEPAKENE) 250 MG/5ML syrup 125 mg by Gastric Tube route 2 (two) times daily.     Historical Provider, MD    Allergies as of 01/25/2016 - Review Complete 09/30/2015  Allergen Reaction Noted  . No known allergies  12/03/2014    Family History  Problem Relation Age of Onset  . Colon cancer Neg Hx     Social History  Social History  . Marital Status: Widowed    Spouse Name: N/A  . Number of Children: N/A  . Years of Education: N/A   Occupational History  . Not on file.   Social History Main Topics  . Smoking status: Former Smoker    Quit date: 11/28/2010  . Smokeless tobacco: Not on file  . Alcohol Use: No  . Drug Use: No  . Sexual Activity: Not on file   Other Topics Concern  . Not on file   Social History Narrative    Review of Systems: See HPI, otherwise negative ROS   Physical Exam: BP 128/70 mmHg  Pulse 60  Temp(Src) 97.9 F (36.6 C) (Oral)  Resp 12  SpO2 95% General:   Awake, unable to interact, in NAD Head:   Normocephalic and atraumatic. Abdomen:  Soft, nontender and nondistended. Normal bowel sounds, without guarding, and without rebound.   Neurologic:  NO  NEW FOCAL DEFICITS  Impression/Plan:     MALFUNCTIONING PEG  PLAN:  BEDSIDE PEG CHANGE

## 2016-01-26 NOTE — Telephone Encounter (Signed)
REVIEWED-NO ADDITIONAL RECOMMENDATIONS. 

## 2016-01-26 NOTE — Progress Notes (Signed)
1240 Removed tube that was put in to hold ostomy site open. Replaced with 20 French Replacement Tube. Dr. Darrick Penna used dilators to dilate ostomy site.  Patient tolerated well. Orders sent to [facility]. Called report to Leland Johns  Reported to nurse to remove gauze from under bumper per Dr. Darrick Penna. Nurse verbalized understanding of orders sent over from Dr. Darrick Penna, except for what to access q shift. Questioned to what was to be done Q Shift. Asked Dr. Darrick Penna. Dr. Darrick Penna stated to record position of bumper q shift. Verbalized understanding of all orders.

## 2016-01-29 ENCOUNTER — Encounter (HOSPITAL_COMMUNITY): Payer: Self-pay | Admitting: Gastroenterology

## 2016-01-29 NOTE — Procedures (Signed)
  PROCEDURE TECHNIQUE:  PT PREPPED AND DRAPED. FOLEY REMOVED. BALLOON CHECKED. REPLACED WITH 20 FR BALLOON PEG. 6 CC STERILE WATER IN BALLOON. SKIN AT 3 CM. TOP OF BUMPER AT 4 CM. ALL PORTS FLUSHED AND ASPIRATED.  PLAN: 1. TUBE FEEDS/MEDS TODAY. 2. OPV PRN.

## 2016-05-11 ENCOUNTER — Encounter (HOSPITAL_COMMUNITY): Payer: Self-pay | Admitting: *Deleted

## 2016-05-11 ENCOUNTER — Emergency Department (HOSPITAL_COMMUNITY)
Admission: EM | Admit: 2016-05-11 | Discharge: 2016-05-12 | Disposition: A | Discharge status: Home / Self Care | Payer: Medicare Other | Attending: Emergency Medicine | Admitting: Emergency Medicine

## 2016-05-11 DIAGNOSIS — Z4659 Encounter for fitting and adjustment of other gastrointestinal appliance and device: Secondary | ICD-10-CM | POA: Insufficient documentation

## 2016-05-11 DIAGNOSIS — Z431 Encounter for attention to gastrostomy: Secondary | ICD-10-CM | POA: Diagnosis not present

## 2016-05-11 DIAGNOSIS — T85528A Displacement of other gastrointestinal prosthetic devices, implants and grafts, initial encounter: Secondary | ICD-10-CM

## 2016-05-11 NOTE — ED Provider Notes (Signed)
TIME SEEN: 11:49 PM   HPI:  Pt is a 82 y.o. male with history of dementia, CVA, hypertension who [facility] who presents to the emergency department after his PEG tube fell out today. Staffed with EMS they are not sure how long his PEG tube was out but it appears he was given his daytime medications through his PEG tube. Patient has dementia and is unable to answer questions, follow commands.   ROS: LEVEL 5 CAVEAT SECONDARY TO DEMENTIA    PAST MEDICAL HISTORY/PAST SURGICAL HISTORY:  Past Medical History  Diagnosis Date  . Dementia   . Aspiration pneumonia (HCC)   . DVT (deep venous thrombosis) (HCC)   . Stroke (HCC)   . Hypertension   . Neuropathy (HCC)   . Contracture of hand joint   . Dysphagia   . Renal disorder   . Subdural hemorrhage (HCC)   . PE (pulmonary embolism)     MEDICATIONS:  Prior to Admission medications   Medication Sig Start Date End Date Taking? Authorizing Provider  acetaminophen (TYLENOL) 325 MG tablet Place 2 tablets (650 mg total) into feeding tube every 6 (six) hours as needed for mild pain (or Fever >/= 101). 09/26/15   Starleen Armsawood S Elgergawy, MD  aspirin EC 325 MG tablet Take 1 tablet (325 mg total) by mouth daily. Continue with aspirin total of 6 weeks for DVT prophylaxis postoperative. 09/26/15   Leana Roeawood S Elgergawy, MD  Cholecalciferol (VITAMIN D) 2000 UNITS tablet Place 2,000 Units into feeding tube daily.    Historical Provider, MD  esomeprazole (NEXIUM) 40 MG packet 40 mg by Gastric Tube route 2 (two) times daily.    Historical Provider, MD  HYDROcodone-acetaminophen (NORCO) 5-325 MG tablet Place 1-2 tablets into feeding tube every 6 (six) hours as needed. 09/26/15   Leana Roeawood S Elgergawy, MD  ipratropium-albuterol (DUONEB) 0.5-2.5 (3) MG/3ML SOLN Take 3 mLs by nebulization every 6 (six) hours as needed (FOR SHORTNESS OF BREATH/WHEEZING).     Historical Provider, MD  LORazepam (ATIVAN) 0.5 MG tablet Place 1 tablet (0.5 mg total) into  feeding tube as needed for anxiety. 09/26/15   Leana Roeawood S Elgergawy, MD  Nutritional Supplements (TWOCAL HN) LIQD Place 237 mLs into feeding tube every 6 (six) hours. 09/26/15   Leana Roeawood S Elgergawy, MD  ondansetron (ZOFRAN) 4 MG tablet Place 1 tablet (4 mg total) into feeding tube every 6 (six) hours as needed for nausea. 09/26/15   Leana Roeawood S Elgergawy, MD  sertraline (ZOLOFT) 25 MG tablet 25 mg by Gastric Tube route daily.     Historical Provider, MD  sterile water for irrigation 237 mL Give by tube 3 (three) times daily.    Historical Provider, MD  valproate (DEPAKENE) 250 MG/5ML syrup 125 mg by Gastric Tube route 2 (two) times daily.     Historical Provider, MD    ALLERGIES:  Allergies  Allergen Reactions  . No Known Allergies     SOCIAL HISTORY:  Social History  Substance Use Topics  . Smoking status: Former Smoker    Quit date: 11/28/2010  . Smokeless tobacco: Not on file  . Alcohol Use: No    FAMILY HISTORY: Family History  Problem Relation Age of Onset  . Colon cancer Neg Hx     EXAM: BP 116/71 mmHg  Pulse 66  Temp(Src) 96.3 F (35.7 C) (Rectal)  Resp 20  SpO2 92% Constitutional: Alert but doesn't answer questions or follow commands; afebrile, nontoxic-appearing, in no distress HEAD: Normocephalic EYES:  Conjunctivae clear, PERRL ENT: normal nose; no rhinorrhea; moist mucous membranes NECK: Supple, no meningismus, no LAD  CARD: RRR; S1 and S2 appreciated; no murmurs, no clicks, no rubs, no gallops RESP: Normal chest excursion without splinting or tachypnea; breath sounds clear and equal bilaterally; no wheezes, no rhonchi, no rales, no hypoxia or respiratory distress ABD/GI: Normal bowel sounds; non-distended; soft, non-tender, no rebound, no guarding, no peritoneal signs. G-tube site in epigastric area with no drainage with no erythema or warmth BACK:  The back appears normal and is non-tender to palpation, there is no CVA tenderness EXT: Normal ROM in all joints;  non-tender to palpation; no edema; normal capillary refill; no cyanosis, no calf tenderness or swelling    SKIN: Normal color for age and race; warm; no rash NEURO: Moves all extremities equally   MEDICAL DECISION MAKING: Patient here with PEG tube route. It appears it was placed in 2014 at [facility] Last replaced in our notes on 01/26/2016 by Dr. Darrick Penna with gastroenterology. Had a 20 French PEG tube placed at that time. It does appear that he received medications through his PEG tube on June 14. No sign of stranding infection around the PEG tube site. Abdomen is soft and nontender to palpation. We will replace PEG tube in the emergency department.  ED PROGRESS: Unable to replace PEG tube given large amount of granulation tissue around the gastrostomy site. Placed 14 French Foley catheter.  It appears he did receive a feeding at 5 PM last night but has missed his 9 PM feeding, 1 AM feeding and has another feeding do at 5 AM. It appears he receives all medications and nutrition through this PEG tube. We'll discuss with GI on call for further recommendations.   2:45 AM  D/w Dr. Karilyn Cota with gastroenterology. He recommends keeping patient in the emergency department and someone with gastroenterology will come in in the morning to replace his G-tube. We appreciate his help.   8:00 AM  Pt resting comfortably.  Waiting for GI to come to the hospital to replace his G-tube.  FEEDING TUBE REPLACEMENT Date/Time: 05/12/2016  Performed by: Raelyn Number Authorized by: Raelyn Number Consent: The procedure was performed in an emergent situation. Patient identity confirmed: arm band Preparation: Patient was prepped and draped in the usual sterile fashion. Indications: tube dislodged Local anesthesia used: no Patient sedated: no Tube type: gastrostomy Patient position: supine Procedure type: replacement Tube size: 14 French Foley catheter placed; attempted to replace with 20 [place] G-tube but were  unsuccessful due to large amount of granulation tissue around the opening of the gastrostomy site Endoscope used: no Bulb inflation volume: 5 (ml) Bulb inflation fluid: normal saline Placement/position confirmation: x-ray Tube placement difficulty: none Patient tolerance: Patient tolerated the procedure well with no immediate complications    I personally performed the services described in this documentation, which was scribed in my presence. The recorded information has been reviewed and is accurate.    Layla Maw Nyana Haren, DO 05/12/16 0800

## 2016-05-11 NOTE — ED Notes (Signed)
Pt brought in by [facility] for c/o peg tube placement; staff reported to ems that they do not know how long pt has been without peg tube

## 2016-05-12 ENCOUNTER — Encounter (HOSPITAL_COMMUNITY)
Admission: EM | Disposition: A | Discharge status: Still In Hospital | Payer: Self-pay | Source: Home / Self Care | Attending: Emergency Medicine

## 2016-05-12 DIAGNOSIS — Z431 Encounter for attention to gastrostomy: Secondary | ICD-10-CM

## 2016-05-12 HISTORY — PX: PEG PLACEMENT: SHX5437

## 2016-05-12 SURGERY — REPLACEMENT, PEG TUBE, WITHOUT ENDOSCOPY
Anesthesia: LOCAL

## 2016-05-12 NOTE — Progress Notes (Signed)
Patient ID: Juan Owens, male   DOB: [birth year], 82 y.o.   MRN: [id number]    Patient presents with displaced, previously placed,  20 French PEG tube. Came out sometime at the nursing home. Presented to the ED overnight. Staff placed a 14 French Foley to salvage the ostomy. We were called to replace it. Examination of patient at bedside today revealed a 14 [place] Foley in place. Balloon was not inflated  - was easily removed. The ostomy had tightened up a small bit of granulation tissue present. I obtained a new 20 [place] Microvasive balloon bumper PEG tube and attempted to place it through the ostomy. Ostomy was too small. I serially dilated the ostomy with cervical dilators up to a #8 size. Subsequently I was able to place a Microvasive 20 [place] PEG tube to the ostomy. There is immediate flow of gastric contents. I inflated the balloon with 6 mL of sterile water and I appropriately snugged the external bumper at 2 cm.    There was excellent auscultation of insufflated air indicating proper placement. Patient tolerated the procedure well. No apparent complications. Blood loss minimal.  Staff may resume usual use and care of PEG tube. Will have an abdominal binder placed to protect the PEG tube.

## 2016-05-13 ENCOUNTER — Encounter (HOSPITAL_COMMUNITY): Payer: Self-pay | Admitting: Internal Medicine

## 2016-10-09 ENCOUNTER — Encounter (HOSPITAL_COMMUNITY): Payer: Self-pay

## 2016-10-09 ENCOUNTER — Emergency Department (HOSPITAL_COMMUNITY)
Admission: EM | Admit: 2016-10-09 | Discharge: 2016-10-09 | Disposition: A | Discharge status: Home / Self Care | Payer: Medicare Other | Attending: Emergency Medicine | Admitting: Emergency Medicine

## 2016-10-09 DIAGNOSIS — F0390 Unspecified dementia without behavioral disturbance: Secondary | ICD-10-CM | POA: Diagnosis not present

## 2016-10-09 DIAGNOSIS — I10 Essential (primary) hypertension: Secondary | ICD-10-CM | POA: Diagnosis not present

## 2016-10-09 DIAGNOSIS — Z87891 Personal history of nicotine dependence: Secondary | ICD-10-CM | POA: Diagnosis not present

## 2016-10-09 DIAGNOSIS — Z7982 Long term (current) use of aspirin: Secondary | ICD-10-CM | POA: Diagnosis not present

## 2016-10-09 DIAGNOSIS — K9420 Gastrostomy complication, unspecified: Secondary | ICD-10-CM

## 2016-10-09 DIAGNOSIS — K9423 Gastrostomy malfunction: Secondary | ICD-10-CM | POA: Insufficient documentation

## 2016-10-09 DIAGNOSIS — Z79899 Other long term (current) drug therapy: Secondary | ICD-10-CM | POA: Diagnosis not present

## 2016-10-09 HISTORY — DX: Traumatic subdural hemorrhage with loss of consciousness of unspecified duration, initial encounter: S06.5X9A

## 2016-10-09 HISTORY — DX: Hypo-osmolality and hyponatremia: E87.1

## 2016-10-09 HISTORY — DX: Anemia, unspecified: D64.9

## 2016-10-09 HISTORY — DX: Unspecified mood (affective) disorder: F39

## 2016-10-09 HISTORY — DX: Anxiety disorder, unspecified: F41.9

## 2016-10-09 HISTORY — DX: Hypotension, unspecified: I95.9

## 2016-10-09 HISTORY — DX: Traumatic subdural hemorrhage with loss of consciousness status unknown, initial encounter: S06.5XAA

## 2016-10-09 NOTE — ED Notes (Signed)
Pt stable and ready for transport to [facility] via [facility].  Report called to April Bryant of [facility].

## 2016-10-09 NOTE — ED Triage Notes (Signed)
Pt arrived from [facility].  Staff reports he pulled his peg tube out this morning.  Pt alert, nonverbal.  Staff says pt usually nonverbal and can be combative.  Reports staff was unable to obtain vitals this morning due to combativeness.  Staff reports this is pt's usual baseline mental status.

## 2016-10-09 NOTE — ED Provider Notes (Signed)
[facility]-EMERGENCY DEPT Provider Note   CSN: [id number] Arrival date & time: 10/09/16  1034  By signing my name below, I, [name], attest that this documentation has been prepared under the direction and in the presence of [name], MD  Electronically Signed: Clovis PuAvnee Patel, ED Scribe. 10/09/16. 11:02 AM.  LEVEL 5 CAVEAT DUE TO DEMENTIA   History   Chief Complaint Chief Complaint  Patient presents with  . Other    needs peg tube replaced   The history is provided by the nursing home. No language interpreter was used.   LEVEL 5 CAVEAT DUE TO DEMENTIA  HPI Comments:  Juan Owens is a 82 y.o. male, with a hx of dementia, who presents to the Emergency Department needing his PEG tube replaced. According to nursing home pt pulled out his 5220 French PEG tube and was sent to the ED to have it replaced. No other recent issues  Past Medical History:  Diagnosis Date  . Aspiration pneumonia (HCC)   . Contracture of hand joint   . Dementia   . DVT (deep venous thrombosis) (HCC)   . Dysphagia   . Hypertension   . Neuropathy (HCC)   . PE (pulmonary embolism)   . Renal disorder   . Stroke (HCC)   . Subdural hemorrhage [name](HCC)     Patient Active Problem List   Diagnosis Date Noted  . HCAP (healthcare-associated pneumonia)   . Hospital acquired PNA   . Hypokalemia   . Abnormal CXR   . Pneumonia   . Pre-op evaluation   . Pulmonary hypertension 09/20/2015  . Normocytic anemia 09/20/2015  . Sepsis due to undetermined organism (HCC) 09/19/2015  . Intertrochanteric fracture of left hip (HCC) 09/19/2015  . Dementia with behavioral disturbance 09/19/2015  . Acute respiratory failure with hypoxia (HCC) 09/19/2015  . Thrombocytopenia (HCC) 09/19/2015  . Subdural hematoma (HCC) 09/19/2015  . S/P percutaneous endoscopic gastrostomy (PEG) tube placement (HCC) 09/03/2015  . Abnormal transaminases 03/05/2015  . Gastric regurgitation 10/13/2014  . Dysphagia 09/23/2013    Past Surgical  History:  Procedure Laterality Date  . INTRAMEDULLARY (IM) NAIL INTERTROCHANTERIC Left 09/22/2015   Procedure: INTRAMEDULLARY (IM) NAIL INTERTROCHANTRIC/ HIP SCREW;  Surgeon: Tarry KosNaiping M Xu, MD;  Location: [facility] OR;  Service: Orthopedics;  Laterality: Left;  . IVC filter    . PEG PLACEMENT  April [facility]  . PEG PLACEMENT N/A 07/02/2015   Procedure: PERCUTANEOUS ENDOSCOPIC GASTROSTOMY (PEG) REPLACEMENT;  Surgeon: West BaliSandi L Fields, MD;  Location: [facility] ENDO SUITE;  Service: Endoscopy;  Laterality: N/A;  . PEG PLACEMENT N/A 09/30/2015   Procedure: PERCUTANEOUS ENDOSCOPIC GASTROSTOMY (PEG) REPLACEMENT;  Surgeon: West BaliSandi L Fields, MD;  Location: [facility] SUITE;  Service: Endoscopy;  Laterality: N/A;  . PEG PLACEMENT N/A 01/26/2016   Procedure: PERCUTANEOUS ENDOSCOPIC GASTROSTOMY (PEG) REPLACEMENT;  Surgeon: West BaliSandi L Fields, MD;  Location: [facility];  Service: Endoscopy;  Laterality: N/A;  1230  . PEG PLACEMENT N/A 05/12/2016   Procedure: PERCUTANEOUS ENDOSCOPIC GASTROSTOMY (PEG) REPLACEMENT;  Surgeon: Corbin Adeobert M Rourk, MD;  Location: [facility];  Service: Endoscopy;  Laterality: N/A;       Home Medications    Prior to Admission medications   Medication Sig Start Date End Date Taking? Authorizing Provider  acetaminophen (TYLENOL) 325 MG tablet Place 2 tablets (650 mg total) into feeding tube every 6 (six) hours as needed for mild pain (or Fever >/= 101). 09/26/15   Starleen Armsawood S Elgergawy, MD  aspirin EC  325 MG tablet Take 1 tablet (325 mg total) by mouth daily. Continue with aspirin total of 6 weeks for DVT prophylaxis postoperative. 09/26/15   Leana Roeawood S Elgergawy, MD  Cholecalciferol (VITAMIN D) 2000 UNITS tablet Place 2,000 Units into feeding tube daily.    Historical Provider, MD  esomeprazole (NEXIUM) 40 MG packet 40 mg by Gastric Tube route 2 (two) times daily.    Historical Provider, MD  HYDROcodone-acetaminophen (NORCO) 5-325 MG tablet Place 1-2 tablets into feeding tube every 6 (six)  hours as needed. 09/26/15   Leana Roeawood S Elgergawy, MD  ipratropium-albuterol (DUONEB) 0.5-2.5 (3) MG/3ML SOLN Take 3 mLs by nebulization every 6 (six) hours as needed (FOR SHORTNESS OF BREATH/WHEEZING).     Historical Provider, MD  LORazepam (ATIVAN) 0.5 MG tablet Place 1 tablet (0.5 mg total) into feeding tube as needed for anxiety. 09/26/15   Leana Roeawood S Elgergawy, MD  Nutritional Supplements (TWOCAL HN) LIQD Place 237 mLs into feeding tube every 6 (six) hours. 09/26/15   Leana Roeawood S Elgergawy, MD  ondansetron (ZOFRAN) 4 MG tablet Place 1 tablet (4 mg total) into feeding tube every 6 (six) hours as needed for nausea. 09/26/15   Leana Roeawood S Elgergawy, MD  sertraline (ZOLOFT) 25 MG tablet 25 mg by Gastric Tube route daily.     Historical Provider, MD  sterile water for irrigation 237 mL Give by tube 3 (three) times daily.    Historical Provider, MD  valproate (DEPAKENE) 250 MG/5ML syrup 125 mg by Gastric Tube route 2 (two) times daily.     Historical Provider, MD    Family History Family History  Problem Relation Age of Onset  . Colon cancer Neg Hx     Social History Social History  Substance Use Topics  . Smoking status: Former Smoker    Quit date: 11/28/2010  . Smokeless tobacco: Not on file  . Alcohol use No     Allergies   No known allergies   Review of Systems Review of Systems  Unable to perform ROS: Dementia   LEVEL 5 CAVEAT DUE TO DEMENTIA   Physical Exam Updated Vital Signs BP 118/83 (BP Location: Left Arm)   Pulse (!) 57   Temp (!) 93.2 F (34 C) (Rectal) Comment: Dr. Patria Maneampos notified  Resp 20   SpO2 97%   Physical Exam  Constitutional: He appears well-developed and well-nourished.  HENT:  Head: Normocephalic.  Eyes: EOM are normal.  Neck: Normal range of motion.  Pulmonary/Chest: Effort normal.  Abdominal: Soft. He exhibits no distension. There is no tenderness.  Gastrostomy tube stoma without surrounding signs of infection or bleeding.   Musculoskeletal: Normal  range of motion.  Neurological: He is alert.  Psychiatric: He has a normal mood and affect.  Nursing note and vitals reviewed.   ED Treatments / Results  DIAGNOSTIC STUDIES:  Oxygen Saturation is 97% on RA, normal by my interpretation.    COORDINATION OF CARE:  10:50 AM Discussed treatment plan with pt at bedside and pt agreed to plan.  Labs (all labs ordered are listed, but only abnormal results are displayed) Labs Reviewed - No data to display  EKG  EKG Interpretation None       Radiology No results found.  Procedures Procedures (including critical care time)      +++++++++++++++++++++++++++++++++++++++  Gastrostomy tube replacement Performed by: Lyanne CoAMPOS,Kandra Graven M Consent: Verbal consent obtained. Risks and benefits: risks, benefits and alternatives were discussed Required items: required blood products, implants, devices, and special equipment available Patient identity confirmed:  hospital-assigned identification number Time out: Immediately prior to procedure a "time out" was called to verify the correct patient, procedure, equipment, support staff and site/side marked as required. Preparation: Patient was prepped and draped in the usual sterile fashion. Patient tolerance: Patient tolerated the procedure well with no immediate complications. Comments: 20 french Gastrostomy tube placed without difficulty  ++++++++++++++++++++++++++++++++++++++++++     Medications Ordered in ED Medications - No data to display   Initial Impression / Assessment and Plan / ED Course  I have reviewed the triage vital signs and the nursing notes.  Pertinent labs & imaging results that were available during my care of the patient were reviewed by me and considered in my medical decision making (see chart for details).  Clinical Course    G tube placed after dilation with several smaller foley catheters. Well appearing. No other recent issues. Dc back to nursing  facility  Final Clinical Impressions(s) / ED Diagnoses   Final diagnoses:  Complication of gastrostomy tube [facility])    New Prescriptions New Prescriptions   No medications on file   I personally performed the services described in this documentation, which was scribed in my presence. The recorded information has been reviewed and is accurate.        Azalia Bilis, MD 10/09/16 [phone]

## 2016-12-29 DEATH — deceased

## 2017-06-04 IMAGING — RF DG HIP (WITH PELVIS) OPERATIVE*L*
1 series · 2 of 2 positions shown · non-contrast
Comparison: 09/18/2015.

CLINICAL DATA: 84-year-old male status post ORIF of the left hip.

EXAM:
OPERATIVE LEFT HIP (WITH PELVIS IF PERFORMED) 2 VIEWS
TECHNIQUE: Fluoroscopic spot image(s) were submitted for interpretation
post-operatively.

[Series 1: run · 2 of 2 slices shown]
[im 1/2]
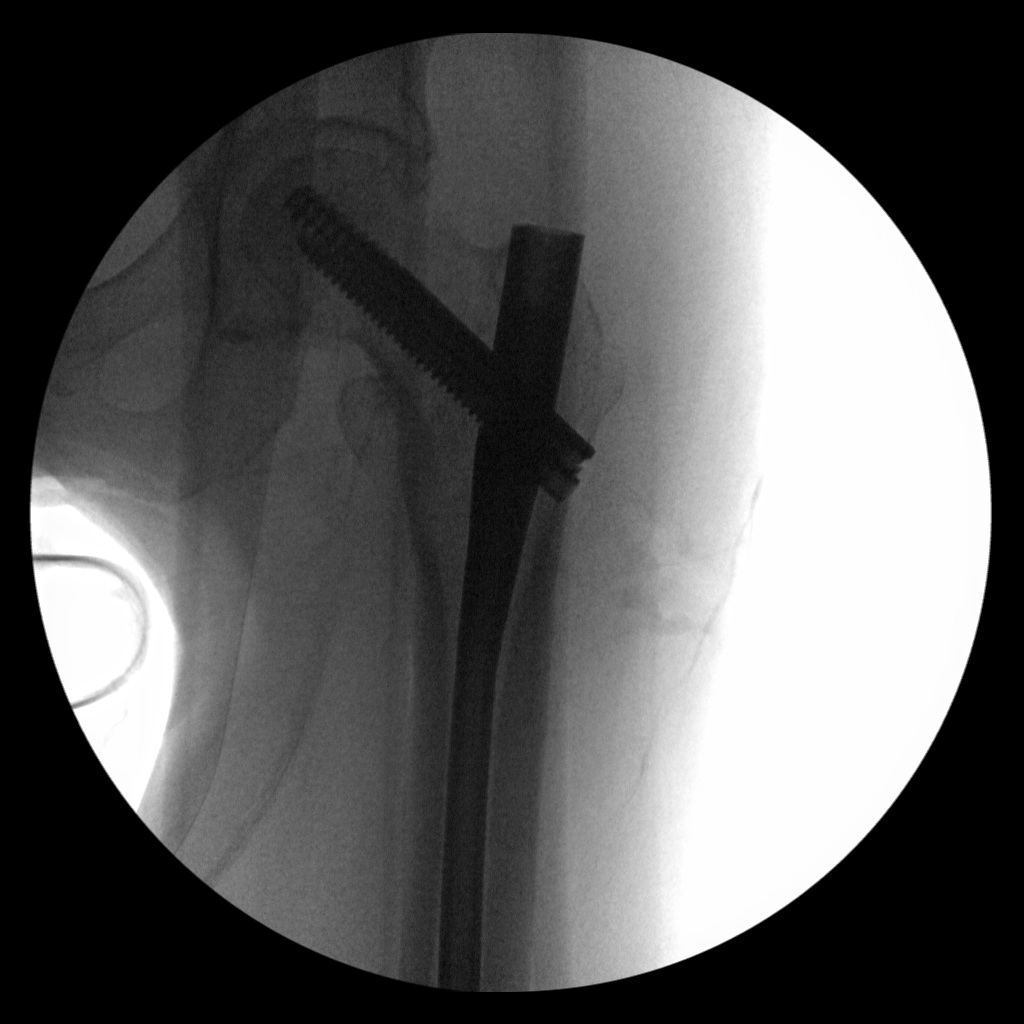
[im 2/2]
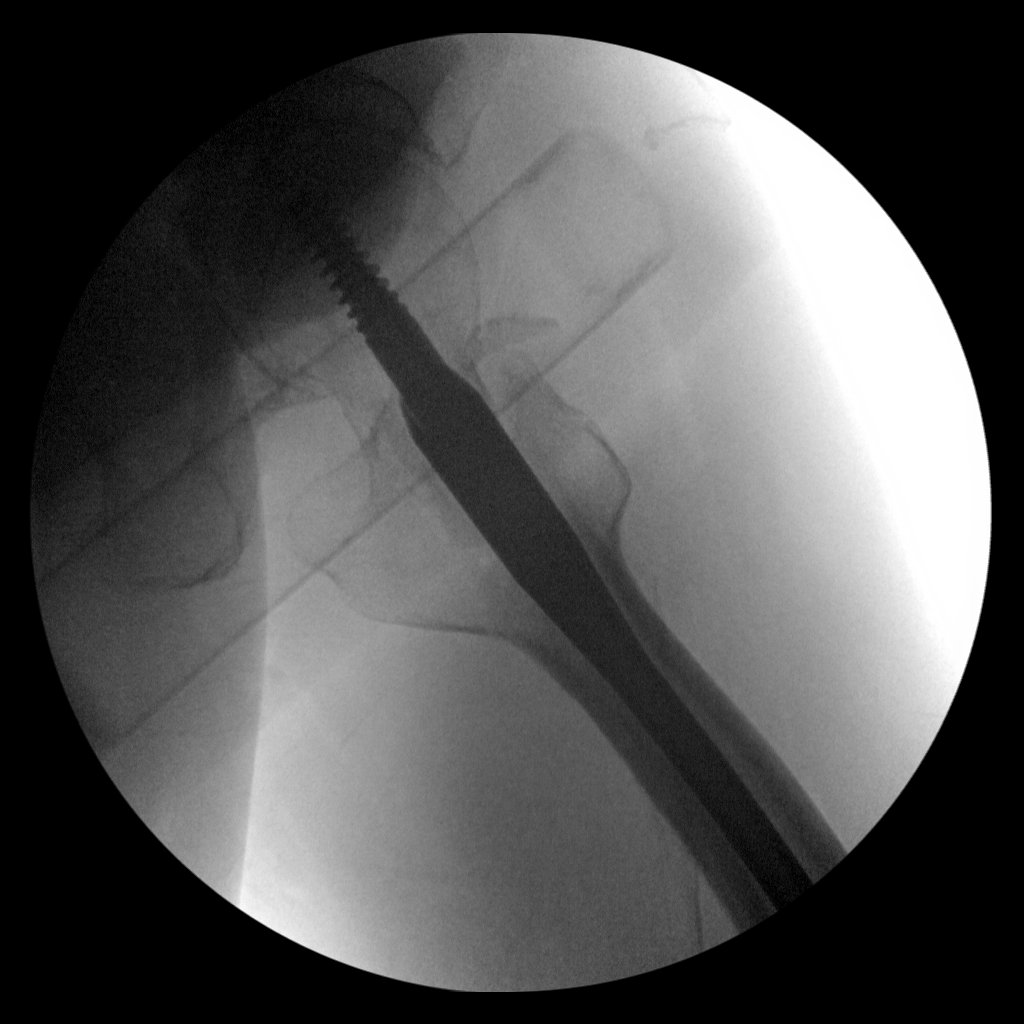

[2 of 2 positions shown; findings below may reference images not displayed]

FINDINGS: Two intraoperative spot views of the left hip demonstrate interval
placement of an intra medullary rod and screw fixation device
traversing the previously noted comminuted intertrochanteric left
hip fracture, with restoration of anatomic alignment. No acute
complicating features. Femoral head is properly located.
IMPRESSION: 1. Intraoperative documentation of ORIF for left hip fracture, as
above.

## 2017-06-06 IMAGING — CT CT HIP*L* W/O CM
1 series · 8 of 32 positions shown, 9 images · non-contrast
Comparison: None.

CLINICAL DATA: Left hip fracture.  Anemia.

EXAM:
CT OF THE LEFT HIP WITHOUT CONTRAST
TECHNIQUE: Multidetector CT imaging of the left hip was performed according to
the standard protocol. Multiplanar CT image reconstructions were
also generated.

[Series 207: o-mar, soft tissue · axial · 0.32mm/px · z∈[-79,+219]mm · 8 of 171 slices shown, 9 images]
[im 11/171  soft-tissue]
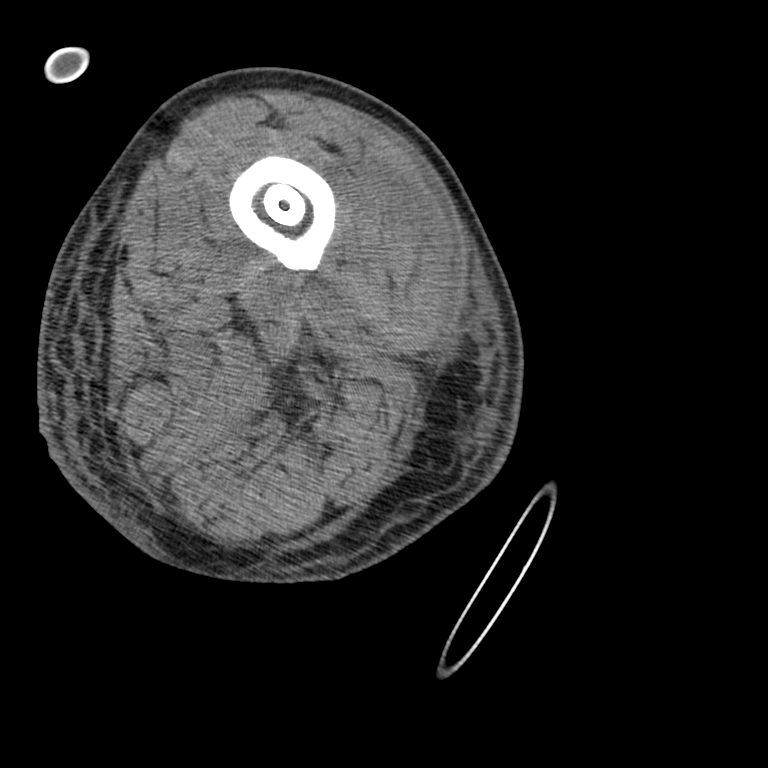
[im 11/171  bone]
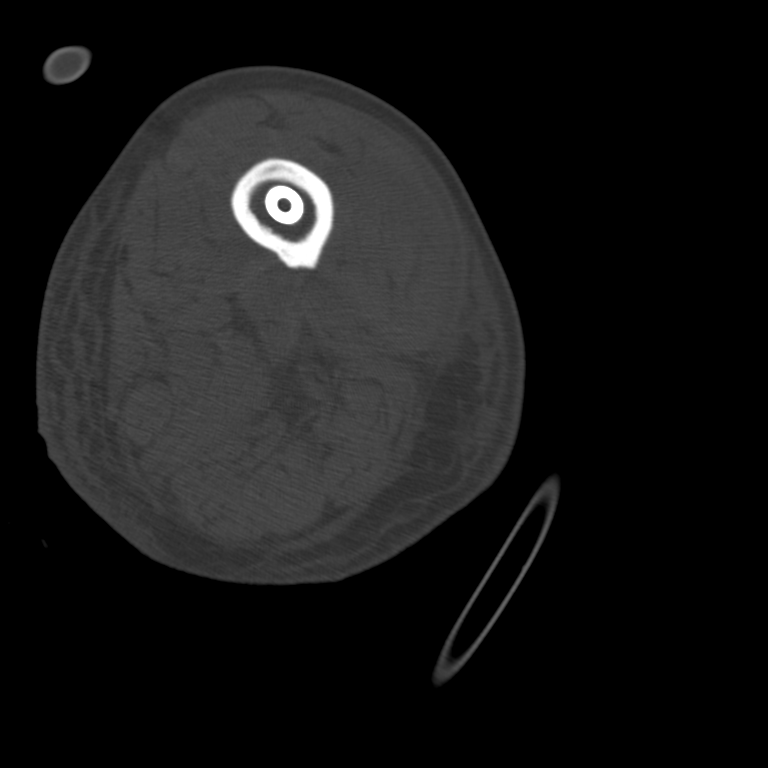
[im 33/171  soft-tissue]
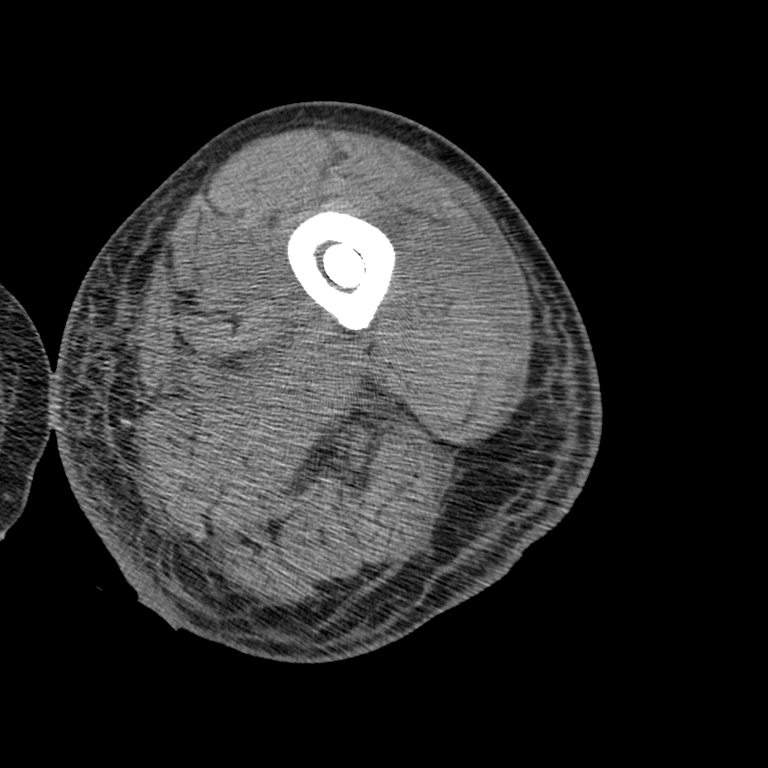
[im 55/171  soft-tissue]
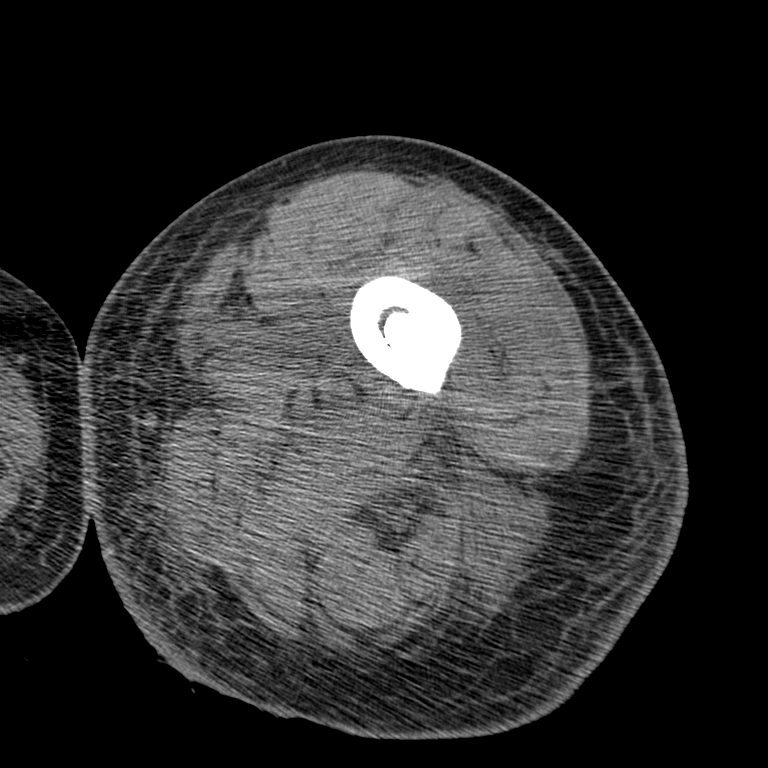
[im 77/171  soft-tissue]
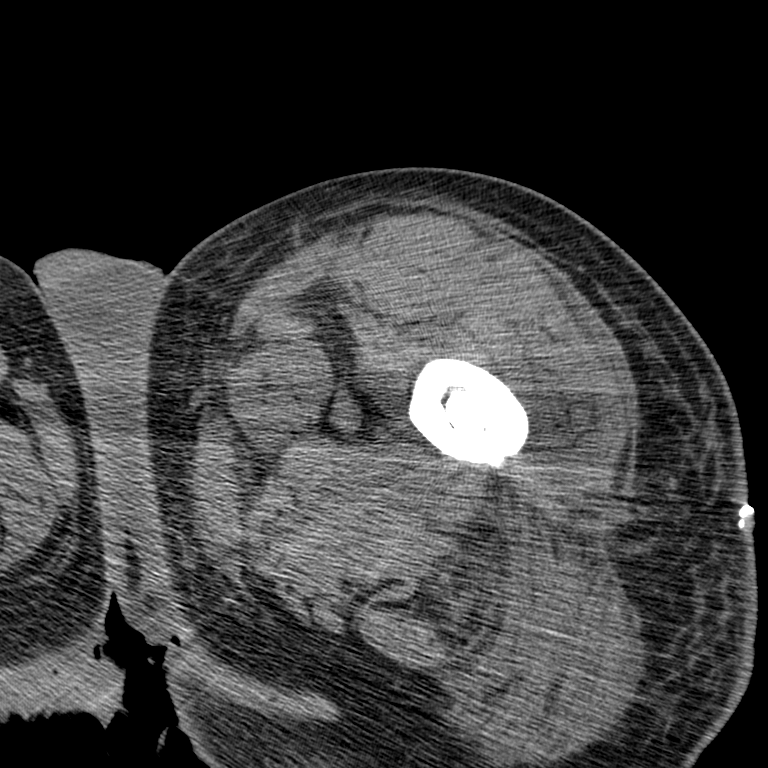
[im 94/171  soft-tissue]
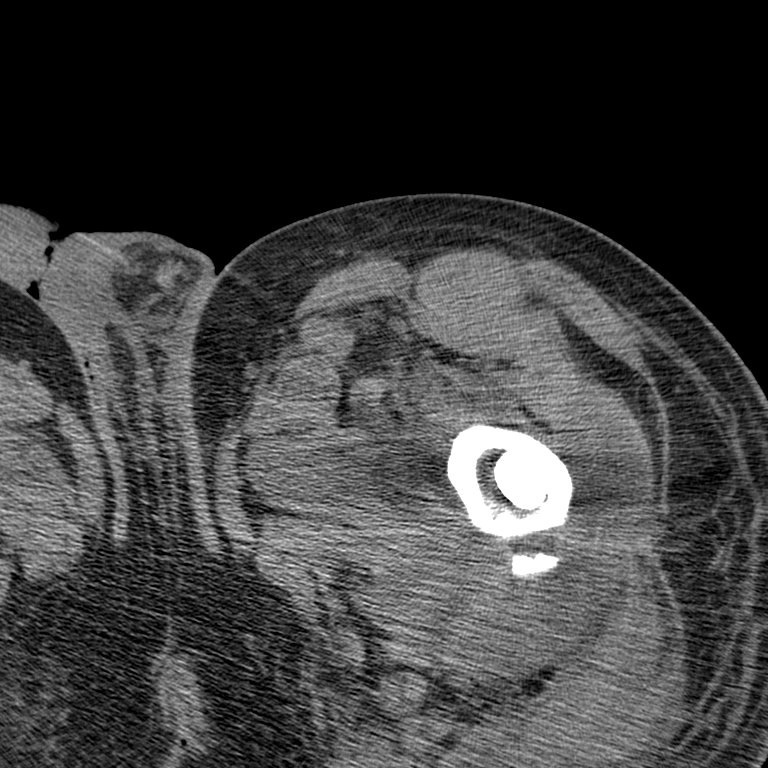
[im 116/171  soft-tissue]
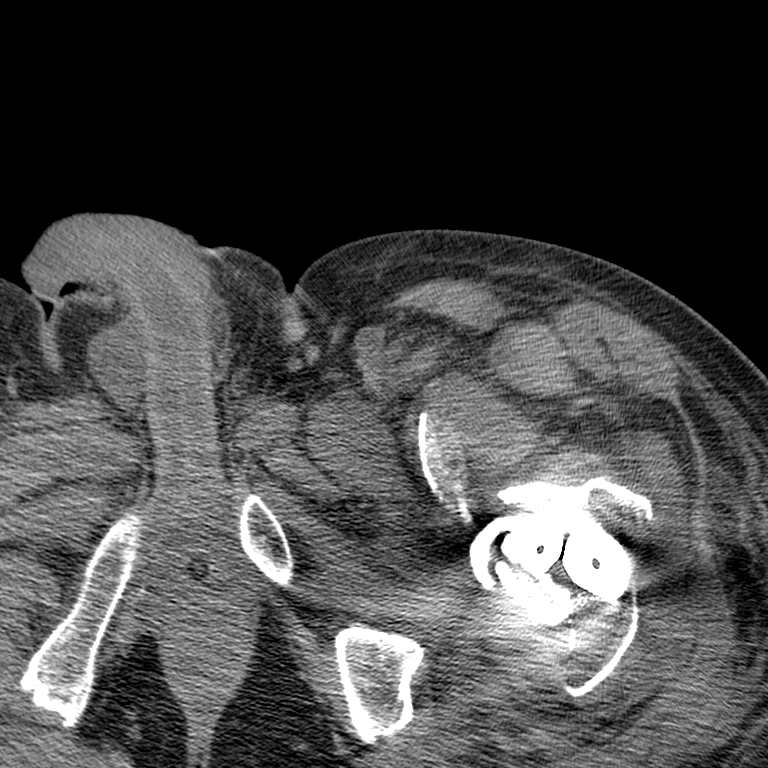
[im 138/171  soft-tissue]
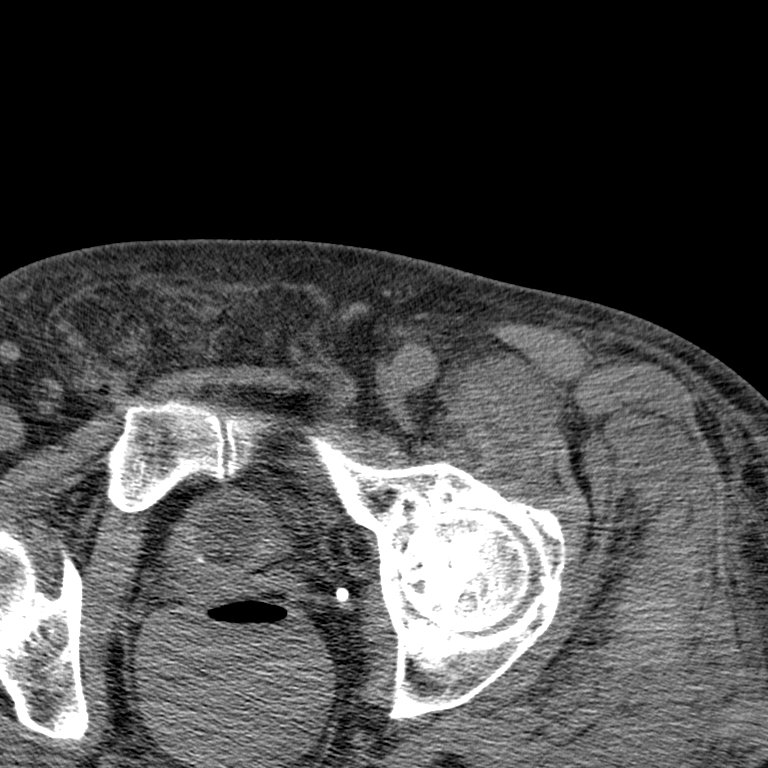
[im 160/171  soft-tissue]
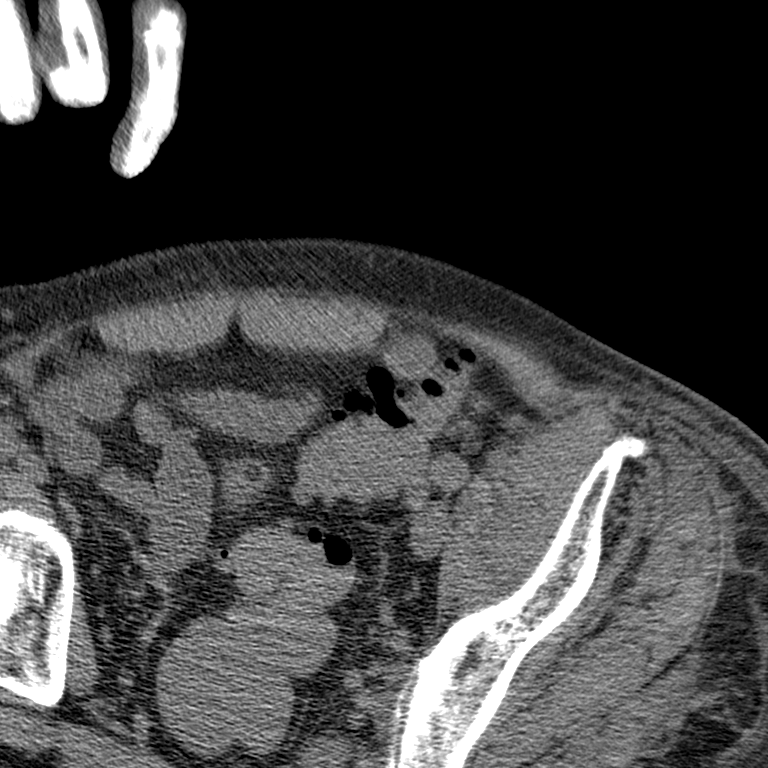

[8 of 32 positions shown; findings below may reference images not displayed]

FINDINGS: There is a comminuted left intertrochanteric fracture transfixed
with an intra medullary nail and 2 cannulated interlocking femoral
neck screws. There is 7 mm of medial displacement of the femoral
shaft relative to the proximal neck of the femur. The lesser
trochanteric fracture fragment is displaced medially and superiorly.
There is severe osteoarthritis with severe axial joint space
narrowing of the left hip with associated subchondral cystic changes
and marginal osteophytosis.

There is expansion of the left iliopsoas muscle compared with the
contralateral side likely reflecting an intramuscular hematoma.
There are postsurgical changes and soft tissue stranding within the
subcutaneous fat around the left hip and left distal femur. There is
a small hematoma in the left gluteus medius muscle measuring 2.3 cm.

There is a fat containing left inguinal hernia. There is no inguinal
lymphadenopathy. There is mild peripheral vascular atherosclerotic
disease.
IMPRESSION: 1. Interval ORIF of a left intertrochanteric fracture. No hardware
failure or complication. 7 mm of medial displacement of the femoral
shaft relative to the femoral neck. Lesser trochanteric fragment is
displaced medially and superiorly.
2. Expansion of the left iliopsoas muscle most concerning for an
intramuscular hematoma. Small intramuscular hematoma in the left
gluteus medius muscle measuring 2.3 cm.
3. Severe osteoarthritis of the left hip.

## 2019-11-03 NOTE — Progress Notes (Signed)
REVIEWED-NO ADDITIONAL RECOMMENDATIONS.
